# Patient Record
Sex: Male | Born: 1940 | Race: White | Hispanic: No | Marital: Married | Smoking: Current every day smoker
Health system: Southern US, Community
[De-identification: ages and names within clinical notes are randomized; demographics above are authoritative.]

## PROBLEM LIST (undated history)

## (undated) DIAGNOSIS — E119 Type 2 diabetes mellitus without complications: Secondary | ICD-10-CM

## (undated) DIAGNOSIS — I10 Essential (primary) hypertension: Secondary | ICD-10-CM

## (undated) DIAGNOSIS — R001 Bradycardia, unspecified: Secondary | ICD-10-CM

## (undated) DIAGNOSIS — I739 Peripheral vascular disease, unspecified: Secondary | ICD-10-CM

## (undated) DIAGNOSIS — N183 Chronic kidney disease, stage 3 unspecified: Secondary | ICD-10-CM

## (undated) DIAGNOSIS — E785 Hyperlipidemia, unspecified: Secondary | ICD-10-CM

## (undated) DIAGNOSIS — G459 Transient cerebral ischemic attack, unspecified: Secondary | ICD-10-CM

## (undated) DIAGNOSIS — K219 Gastro-esophageal reflux disease without esophagitis: Secondary | ICD-10-CM

## (undated) HISTORY — PX: APPENDECTOMY: SHX54

## (undated) HISTORY — PX: OTHER SURGICAL HISTORY: SHX169

---

## 2001-07-10 ENCOUNTER — Encounter (INDEPENDENT_AMBULATORY_CARE_PROVIDER_SITE_OTHER): Payer: Self-pay | Admitting: Specialist

## 2001-07-10 ENCOUNTER — Ambulatory Visit (HOSPITAL_COMMUNITY): Admission: RE | Admit: 2001-07-10 | Discharge: 2001-07-10 | Payer: Self-pay | Admitting: Gastroenterology

## 2002-02-24 ENCOUNTER — Inpatient Hospital Stay (HOSPITAL_COMMUNITY): Admission: EM | Admit: 2002-02-24 | Discharge: 2002-02-25 | Payer: Self-pay | Admitting: Emergency Medicine

## 2002-02-24 ENCOUNTER — Encounter: Payer: Self-pay | Admitting: Pediatrics

## 2002-02-24 ENCOUNTER — Encounter: Payer: Self-pay | Admitting: Emergency Medicine

## 2009-06-24 ENCOUNTER — Encounter: Admission: RE | Admit: 2009-06-24 | Discharge: 2009-06-24 | Payer: Self-pay | Admitting: Gastroenterology

## 2010-09-30 NOTE — H&P (Signed)
NAME:  Jesse James, Jesse James                        ACCOUNT NO.:  [id number]   MEDICAL RECORD NO.:  [id number]                   PATIENT TYPE:  EMS   LOCATION:  MINO                                 FACILITY:  [facility]   PHYSICIAN:  Deanna Artis. Sharene Skeans, M.D.           DATE OF BIRTH:  [birth year]   DATE OF ADMISSION:  02/24/2002  DATE OF DISCHARGE:                                HISTORY & PHYSICAL   CHIEF COMPLAINT:  Left facial weakness and slurred speech.   HISTORY OF PRESENT ILLNESS:  The patient had onset of symptoms after he had  awakened.  He had had his first cup of coffee and has been able to swallow  it fine.  Around 7:30 he noted that he had difficulty swallowing and  dribbling from the left side of his mouth.  He felt somewhat numb.  He had  no other symptoms at that time and drove to [place], [place], to  drop off a piece of equipment.  His friend noted around 9:30 that the left  corner of his mouth was drooping and told him he needed to get to the  hospital right away.  He arrived at [facility] around  10:22 and was seen by Trudi Ida. Denton Lank, M.D., at 10:30.  Call was placed to  me at 11 o'clock.  CT scan at 11:06.  His symptoms have been stable to  improved since then.   PAST MEDICAL HISTORY:  1. Right body TIA with slurred speech in 1993.  2. Hypercholesterolemia.  3. Neoplastic colonic polyps.  4. Seasonal affective disorder.  5. Tobacco abuse.  6. Peripheral vascular disease with intermittent claudication.   REVIEW OF SYSTEMS:  CONSTITUTIONAL:  Normal appetite and sleep patterns.  HEENT:  No signs of infection or epistaxis.  LUNGS:  No chest pain,  respiratory distress, asthma, bronchitis or pneumonia.  No sputum  production.  No dyspnea.  CARDIOVASCULAR:  No chest pain, palpitations, or  problems with hypertension.  GASTROINTESTINAL:  No nausea, vomiting,  diarrhea or constipation.  No hematemesis or melena.  GENITOURINARY:  No  urinary tract  infection or hematuria.  No dysuria.  MUSCULOSKELETAL:  No  fractures or deformities.  No arthritis, arthralgias or limitation of range  of motion of bones and joints.  No problems in the back.  SKIN:  The patient  had stasis changes in his legs from veinous insufficiency.  No other rash or  neurocutaneous lesions.  ENDOCRINE:  No diabetes, no thyroid disease.  REPRODUCTIVE:  No noted problems with erection, ejaculation.  NEUROPSYCHIATRIC:  The patient has seasonal depression as noted above.  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  He has some amblyopic  changes but no true diplopia.  The patient has not had weakness, numbness or  tingling in his arms or legs.  See  above for his face.  No changes in  hearing.  No tinnitus.  No seizures, loss of consciousness, no loss of bowel  or bladder control.  Review of systems is otherwise negative.   PAST SURGICAL HISTORY:  The patient had colonic polyps removed via  colonoscopy and subsequent colonoscopies have been unremarkable.   MEDICATIONS:  1. Pravachol 40 mg per day.  2. Aspirin 325 mg per day.  3. Trental 400 mg three times a day.  4. Wellbutrin (bupropion) 150 mg twice a day.  5. Lorazepam 1 mg b.i.d.   ALLERGIES:  No known drug allergies.   FAMILY HISTORY:  Mother died at age 61.  She had a congenital heart valve,  multiple MIs, and congestive heart failure.  Father died at age 24 of  myocardial infarction.  Brother died at age 51 of myocardial infarction.  There is a very heavy family history of hypertension and diabetes but no  cerebrovascular disease.   SOCIAL HISTORY:  The patient is a smoker, now smoking two packs of  cigarettes per day.  He is placed on Wellbutrin with the express purpose of  getting him off of tobacco.  He likes smoking.  No use of drugs or alcohol.  The patient works in a rental business in Engineer, maintenance (IT).  He  has been  married for about five years.   PHYSICAL EXAMINATION:  VITAL SIGNS:  Blood pressure 161/73, resting pulse  71, respiratory rate 18, temperature 99, pulse oximeter 97%.  SKIN:  Stasis changes in his ankles and calves related to veinous  insufficiency.  No edema, cyanosis.  The patient has fungal disease of his  toes.  HEENT:  No signs of infection, no bruits.  LUNGS:  Clear.  CARDIOVASCULAR:  No murmurs, pulses normal.  ABDOMEN:  Soft, bowel sounds normal.  No hepatosplenomegaly.  EXTREMITIES:  Normal except for the cutaneous abnormalities noted above.  NEUROLOGIC:  MENTAL STATUS:  The patient was awake, alert, attentive, and  appropriate. No dysphagia or dyspraxia.  Normal fund of knowledge and  memory.  CRANIAL NERVES:  Round reactive pupils.  Normal fundi.  Visual  fields full to double simultaneous stimuli.  Extraocular movements full and  conjugate.  OKN responses equal bilaterally.  The patient has right eye  amblyopia.  He has a left central seventh.  He is able to protrude his  tongue and elevation his uvula to midline.  MOTOR EXAMINATION:  Normal  strength, tone, and mass.  Good fine motor movements.  No pronator drift.  Sensation intact to cold, vibration, stereognosis in the upper extremities.  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  He has good stereognosis bilaterally.  Cerebellar examination and  gait were normal.  Deep tendon reflexes were normal proximally, absent  distally particularly at the ankles.  The patient had bilateral flexor  plantar responses.   IMPRESSION:  1. 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  2. Risk factors include dyslipidemia, tobacco abuse, prior transient     ischemic attack, and peripheral vascular disease.  PLAN:  MRI of the brain with and without contrast.  MRA  intracranial.  Carotid Doppler.  A 2-D echocardiogram.  Serum lipid panel and homocystine  (both fasting).  Hemoglobin A1C.  We will continue aspirin.  We may add  Plavix.  The patient  should have a speech therapy consult.  No heparin for  now.  This has been discussed with the patient and his wife who is a Engineer, civil (consulting)  at [employer].  Questions were answered.  The patient will be  evaluated and proper treatment will be administered.  He will be placed on  21 mg nicotine patch and will not be allowed to smoke.  I have suggested  ways of smoking cessation to the family including his wife quitting or at  least smoking outside the home.                                               Deanna Artis. Sharene Skeans, M.D.    [facility]  D:  02/24/2002  T:  02/25/2002  Job:  [id number]   cc:   Danise Edge, M.D.  [address]  [place]  [place] [address]  Fax: [phone]

## 2010-09-30 NOTE — Discharge Summary (Signed)
NAME:  Jesse James, Jesse James                        ACCOUNT NO.:  [id number]   MEDICAL RECORD NO.:  [id number]                   PATIENT TYPE:  INP   LOCATION:  3024                                 FACILITY:  [facility]   PHYSICIAN:  Casimiro Needle L. Thad Ranger, M.D.           DATE OF BIRTH:  [birth year]   DATE OF ADMISSION:  02/24/2002  DATE OF DISCHARGE:  02/25/2002                                 DISCHARGE SUMMARY   DISCHARGE DIAGNOSES:  1. 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  2. Hypercholesterolemia.  3. Cigarette abuse.  4. Peripheral vascular disease, intermittent claudication.  5. Transient ischemic attack in 1995 and 1993.  6. Surveillance colonoscopy with removal of adenomatous colon polyps and     universal colonic diverticulosis.  7. Seasonal effective disorder.   DISCHARGE MEDICATIONS:  1. Pravachol 40 mg q.d.  2. Aspirin 325 mg q.d.  3. Plavix 75 mg q.d.  4. Wellbutrin 150 mg sustained release b.i.d.  5. Nicotine patch 21 mg x14 days, then 14 mg x14 days, then 7 mg x14 days,     then stop.  6. Stop Trental.   STUDIES PERFORMED:  1. CT scan showing no acute intracranial abnormality.  Atherosclerotic     changes are noted.  2. MRA showing punctate right parietal infarction.  3. MRA of the head normal.  4. 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  Both sides have calcific and     non-calcific plaque throughout.  5. A 2-D echocardiogram with results pending at time of discharge.  6. Chest x-ray which has no active disease.  7. EKG which shows sinus bradycardia, nonspecific T-wave abnormalities, this     is unconfirmed.   LABORATORY DATA:  CBC showed an elevated white blood cell count at 11.2 with  neutrophils absolute at 8.1.  Coagulation studies were normal.  Chemistries  and liver function  tests were also normal.  Fasting lipid profile showed a  cholesterol of 176, triglycerides of 236, HDL 36, and LDL 93.  Homocystine  is pending at time of discharge.   HISTORY OF PRESENT ILLNESS:  The patient is a 70 year old right-handed white  male who had a history of left facial weakness and slurred speech that began  after awakening the morning of admission at 7:30.  He noticed coughing and  dribbling from his mouth when he first got up.  A friend also noted his  facial weakness at 9:30 that morning, and [facility] at 10:22.  The patient was seen by a neurologist at  11 a.m.  The patient was not eligible for TPA due to minimal deficits and  trembling.   HOSPITAL COURSE:  MRI  did reveal a very small punctate right parietal  infarction that could be the cause of his left facial weakness which is  improving during his hospital course.  Stroke was felt to be due to small  vessel disease with no other etiology noted.  Carotids did show some non-  critical disease with 60 to 80% stenosis on the low end of the range  bilaterally.  Recommendations were for a CVTS followup as an outpatient.  The patient needs to continue his Pravachol as his lipid panel looked good.  We will discontinue Trental which was started by Dr. Demetrius Revel many years ago  most likely for peripheral vascular disease.  We will add Plavix to his  aspirin for stroke prophylaxis.  He has no major deficits and can be  discharged home with his wife.  He will need followup on 2-D echocardiogram  and homocystine.   CONDITION ON DISCHARGE:  The patient is alert and oriented x3.  Speech is  clear.  Does have some mild left lower facial weakness.  Extraocular  movements are intact, and visual fields are full.  Strength is normal.  His  deep tendon reflexes are equal bilaterally.  They are decreased in his  knees.  His cerebellar function is intact.  His gait is steady.  His chest  is  clear to auscultation.  His heart rate is regular.   PLAN:  1. Aspirin and Plavix for stroke prophylaxis.  2. Continue Pravachol for hypercholesterolemia.  3. Stop smoking cigarettes.  On Wellbutrin already.  We will add nicotine     patch.  4. CVTS followup as an outpatient.  The patient is to call for an     appointment.  5. Followup 2-D echocardiogram results.  6. Followup homocystine which is pending at time of discharge.  If     homocystine is elevated we will need to start Foltx one p.o. q.d.      Jesse Main, NP                           Marolyn Hammock. Thad Ranger, M.D.    [facility]  D:  02/25/2002  T:  02/27/2002  Job:  [id number]   cc:   Danise Edge, M.D.  [address]  [address]  [place]  [place] [address]  Fax: [phone]   Francisca December, M.D.  [address]  [address]  [place]  [place] [address]  Fax: [phone]

## 2010-09-30 NOTE — Procedures (Signed)
[facility]  Patient:    Jesse James, Jesse James Visit Number: [id number] MRN: [id number]          Service Type: Attending:  Verlin Grills, M.D. Dictated by:   Verlin Grills, M.D. Proc. Date: 07/10/01                             Procedure Report  PROCEDURE:  Colonoscopy with polypectomy.  PROCEDURE INDICATION:  Jesse James is a 70 year old male born 1941-04-23. He has undergone surveillance colonoscopies in the past with removal of neoplastic but noncancerous polyps. His last colonoscopy five years ago resulted in the removal of a 1-mm tubular adenomatous polyp from the rectum. Jesse James is due for a repeat screening colonoscopy with polypectomy to prevent colon cancer.  ENDOSCOPIST:  Verlin Grills, M.D.  PREMEDICATION:  Versed 7 mg, Demerol 50 mg.  ENDOSCOPE:  Olympus Pediatric colonoscope.  DESCRIPTION OF PROCEDURE:  After obtaining informed consent, Jesse James was placed in the left lateral decubitus position. I administered intravenous Versed and intravenous Demerol to achieve conscious sedation for the procedure. The patients blood pressure, oxygen saturation, and cardiac rhythm were monitored throughout the procedure and documented in the medical record.  Anal inspection was normal. Digital rectal exam revealed a slightly enlarged but nonnodular prostate. The Olympus Pediatric video colonoscope was introduced into the rectum and easily advanced to the proximal ascending colon. In order to intubate the cecum, Jesse James was rolled to the prone position. Colonic preparation for the exam today was excellent.  Jesse James has universal colonic diverticulosis without endoscopic evidence for the presence of diverticulitis or diverticular stricture formation.  Rectum:  A 0.5-mm sessile polyp was removed from the distal rectum with the electrocautery snare.  Sigmoid colon and descending colon:  At 40 cm from the  anal verge, a 3-mm sessile polyp was removed with the electrocautery snare.  Splenic flexure:  Normal.  Transverse colon:  Normal.  Hepatic flexure:  Normal.  Ascending colon:  From the proximal ascending colon, at 2-mm sessile polyp was removed with electrocautery snare.  Cecum and ileocecal valve:  Normal.  ASSESSMENT: 1. Universal colonic diverticulosis. 2. A 2-mm polyp was removed from the proximal ascending colon, a 3-mm polyp    was removed from the sigmoid colon, and a 0.5-mm polyp was removed from the    distal rectum. All polyps were submitted in one bottle for pathological    evaluation. Dictated by:   Verlin Grills, M.D. Attending:  Verlin Grills, M.D. DD:  07/10/01 TD:  07/10/01 Job: [id number]

## 2011-02-23 ENCOUNTER — Ambulatory Visit (HOSPITAL_COMMUNITY)
Admission: RE | Admit: 2011-02-23 | Discharge: 2011-02-23 | Disposition: A | Discharge status: Home / Self Care | Payer: Medicare Other | Source: Ambulatory Visit | Attending: Gastroenterology | Admitting: Gastroenterology

## 2011-02-23 DIAGNOSIS — J449 Chronic obstructive pulmonary disease, unspecified: Secondary | ICD-10-CM | POA: Insufficient documentation

## 2011-02-23 DIAGNOSIS — J4489 Other specified chronic obstructive pulmonary disease: Secondary | ICD-10-CM | POA: Insufficient documentation

## 2011-08-07 ENCOUNTER — Other Ambulatory Visit: Payer: Self-pay | Admitting: Gastroenterology

## 2012-12-25 ENCOUNTER — Ambulatory Visit
Admission: RE | Admit: 2012-12-25 | Discharge: 2012-12-25 | Disposition: A | Discharge status: Home / Self Care | Payer: Medicare Other | Source: Ambulatory Visit | Attending: Gastroenterology | Admitting: Gastroenterology

## 2012-12-25 ENCOUNTER — Other Ambulatory Visit: Payer: Self-pay | Admitting: Gastroenterology

## 2012-12-25 DIAGNOSIS — I8390 Asymptomatic varicose veins of unspecified lower extremity: Secondary | ICD-10-CM

## 2013-02-26 ENCOUNTER — Other Ambulatory Visit (HOSPITAL_COMMUNITY): Payer: Self-pay | Admitting: Interventional Radiology

## 2013-02-26 DIAGNOSIS — I83813 Varicose veins of bilateral lower extremities with pain: Secondary | ICD-10-CM

## 2013-03-25 ENCOUNTER — Ambulatory Visit
Admission: RE | Admit: 2013-03-25 | Discharge: 2013-03-25 | Disposition: A | Discharge status: Home / Self Care | Payer: Medicare Other | Source: Ambulatory Visit | Attending: Interventional Radiology | Admitting: Interventional Radiology

## 2013-03-25 DIAGNOSIS — I83813 Varicose veins of bilateral lower extremities with pain: Secondary | ICD-10-CM

## 2013-03-28 ENCOUNTER — Telehealth: Payer: Self-pay | Admitting: Emergency Medicine

## 2013-03-28 ENCOUNTER — Other Ambulatory Visit (HOSPITAL_COMMUNITY): Payer: Self-pay | Admitting: Interventional Radiology

## 2013-03-28 DIAGNOSIS — I83813 Varicose veins of bilateral lower extremities with pain: Secondary | ICD-10-CM

## 2013-03-28 NOTE — Telephone Encounter (Signed)
RN LMOVM HERE TO MAKE [place] AWARE THAT DR Danise Edge SAID IT WAS OK FOR PT TO STOP HIS PLAVIX 5D PRIOR TO EVLT OF LLE

## 2013-03-28 NOTE — Telephone Encounter (Signed)
CALLED PT TO MAKE HIM AWARE THAT [employer] ARE BILLABLE CODES AND NO AUTHO IS NEEDED FOR VEIN TX ALSO THAT DR Josefa Half OK'D FOR HIM TO STOP HIS PLAVIX 5D PRIOR TO VEIN TX.- HE NEEDS TO D/C PLAVIX ON 04-19-13.  PT AWARE

## 2013-04-24 ENCOUNTER — Ambulatory Visit
Admission: RE | Admit: 2013-04-24 | Discharge: 2013-04-24 | Disposition: A | Discharge status: Home / Self Care | Payer: Medicare Other | Source: Ambulatory Visit | Attending: Interventional Radiology | Admitting: Interventional Radiology

## 2013-04-24 VITALS — BP 140/60 | HR 51 | Temp 97.5°F | Resp 14

## 2013-04-24 DIAGNOSIS — I83813 Varicose veins of bilateral lower extremities with pain: Secondary | ICD-10-CM

## 2013-04-24 MED ORDER — DIAZEPAM 5 MG PO TABS: 10.0000 mg | ORAL_TABLET | Freq: Once | ORAL | Status: DC

## 2013-04-24 NOTE — Progress Notes (Signed)
1610  Informed Consent obtained.  Given verbal & written discharge instructions & states that he understands.  0950  22 gauge x 1" insyte catheter started IV Right antecubital on 1st attempt with Saline lock & 7" extension.  Flushed with 10 ml NSS without difficulty.  SIte unremarkable.  9604  Valium 10 mg po per Dr Miles Costain.  1015  Procedure started.  1130 Procedure completed.  1140  IV d/c'd, catheter intact.  Site unremarkable.  1145  Wearing thigh high graduated compression garment, 20-30 mm Hg, on LLE.  Ambulated x 10 minutes, gait steady.  Tolerated well.    1155 Patient's neighbor here to provide transportation to home.  Discharged to home. Terez Montee Carmell Austria, RN 04/24/2013 11:56 AM

## 2013-04-29 ENCOUNTER — Other Ambulatory Visit (HOSPITAL_COMMUNITY): Payer: Self-pay | Admitting: Interventional Radiology

## 2013-04-29 DIAGNOSIS — I83813 Varicose veins of bilateral lower extremities with pain: Secondary | ICD-10-CM

## 2013-05-01 ENCOUNTER — Ambulatory Visit
Admission: RE | Admit: 2013-05-01 | Discharge: 2013-05-01 | Disposition: A | Discharge status: Home / Self Care | Payer: Medicare Other | Source: Ambulatory Visit | Attending: Interventional Radiology | Admitting: Interventional Radiology

## 2013-05-01 DIAGNOSIS — I83813 Varicose veins of bilateral lower extremities with pain: Secondary | ICD-10-CM

## 2013-05-06 ENCOUNTER — Other Ambulatory Visit (HOSPITAL_COMMUNITY): Payer: Self-pay | Admitting: Interventional Radiology

## 2013-05-06 DIAGNOSIS — I83813 Varicose veins of bilateral lower extremities with pain: Secondary | ICD-10-CM

## 2013-05-19 ENCOUNTER — Telehealth: Payer: Self-pay | Admitting: Emergency Medicine

## 2013-05-19 NOTE — Telephone Encounter (Signed)
CALLED PT TO MAKE HIM AWARE THAT [employer] APPROVED HIS RLE EVLT & SCLERO  APPT GOOD TO GO FOR 05-28-13 AND REMINDED PT TO STOP PLAVIX ON 05-23-13.

## 2013-05-27 ENCOUNTER — Other Ambulatory Visit (HOSPITAL_COMMUNITY): Payer: Self-pay | Admitting: Interventional Radiology

## 2013-05-27 DIAGNOSIS — I83811 Varicose veins of right lower extremities with pain: Secondary | ICD-10-CM

## 2013-05-28 ENCOUNTER — Ambulatory Visit
Admission: RE | Admit: 2013-05-28 | Discharge: 2013-05-28 | Disposition: A | Discharge status: Home / Self Care | Payer: Medicare HMO | Source: Ambulatory Visit | Attending: Interventional Radiology | Admitting: Interventional Radiology

## 2013-05-28 ENCOUNTER — Other Ambulatory Visit (HOSPITAL_COMMUNITY): Payer: Self-pay | Admitting: Interventional Radiology

## 2013-05-28 VITALS — BP 140/59 | HR 51 | Temp 98.3°F | Resp 16 | Ht 70.0 in | Wt 215.0 lb

## 2013-05-28 DIAGNOSIS — I83813 Varicose veins of bilateral lower extremities with pain: Secondary | ICD-10-CM

## 2013-05-28 DIAGNOSIS — I83893 Varicose veins of bilateral lower extremities with other complications: Secondary | ICD-10-CM

## 2013-05-28 DIAGNOSIS — I83811 Varicose veins of right lower extremities with pain: Secondary | ICD-10-CM

## 2013-05-28 MED ORDER — DIAZEPAM 10 MG PO TABS: 10.0000 mg | ORAL_TABLET | Freq: Once | ORAL | Status: DC

## 2013-05-28 NOTE — Progress Notes (Signed)
1250  Informed Consent obtained.  Given verbal & written discharge instructions and states that he understands.     1355  Valium 10 mg po per Dr Annamaria Boots.  1400  22 gauge x 1" insyte catheter started IV Right antecubital (on 1st attempt) with Saline Lock and 7" extension.  Flushed w/ 5 ml NSS without difficulty.  Site unremarkable.  1410  Procedure started.  1520  Procedure completed.  1530  Wearing thigh high graduated compression garment, 20-30 mm Hg on LLE.   Ambulated x 10 mins, gait steady.  Tolerated well.  1540 Reviewed discharge instructions.   1545  Patient's neighbors here to provide transportation to home.  Discharged to home.  Carleigh Buccieri Riki Rusk, RN 05/28/2013 4:05 PM

## 2013-06-04 ENCOUNTER — Ambulatory Visit
Admission: RE | Admit: 2013-06-04 | Discharge: 2013-06-04 | Disposition: A | Discharge status: Home / Self Care | Payer: Medicare HMO | Source: Ambulatory Visit | Attending: Interventional Radiology | Admitting: Interventional Radiology

## 2013-06-04 DIAGNOSIS — I83811 Varicose veins of right lower extremities with pain: Secondary | ICD-10-CM

## 2013-07-08 ENCOUNTER — Ambulatory Visit
Admission: RE | Admit: 2013-07-08 | Discharge: 2013-07-08 | Disposition: A | Discharge status: Home / Self Care | Payer: Medicare HMO | Source: Ambulatory Visit | Attending: Interventional Radiology | Admitting: Interventional Radiology

## 2013-07-08 DIAGNOSIS — I83811 Varicose veins of right lower extremities with pain: Secondary | ICD-10-CM

## 2013-10-23 ENCOUNTER — Other Ambulatory Visit (HOSPITAL_COMMUNITY): Payer: Self-pay | Admitting: Interventional Radiology

## 2013-10-23 DIAGNOSIS — I83813 Varicose veins of bilateral lower extremities with pain: Secondary | ICD-10-CM

## 2013-12-03 ENCOUNTER — Ambulatory Visit
Admission: RE | Admit: 2013-12-03 | Discharge: 2013-12-03 | Disposition: A | Discharge status: Home / Self Care | Payer: Medicare HMO | Source: Ambulatory Visit | Attending: Interventional Radiology | Admitting: Interventional Radiology

## 2013-12-03 DIAGNOSIS — I83813 Varicose veins of bilateral lower extremities with pain: Secondary | ICD-10-CM

## 2014-05-20 ENCOUNTER — Other Ambulatory Visit (HOSPITAL_COMMUNITY): Payer: Self-pay | Admitting: Interventional Radiology

## 2014-05-20 DIAGNOSIS — I83811 Varicose veins of right lower extremities with pain: Secondary | ICD-10-CM

## 2014-06-10 DIAGNOSIS — I10 Essential (primary) hypertension: Secondary | ICD-10-CM | POA: Diagnosis not present

## 2014-06-10 DIAGNOSIS — Z86010 Personal history of colonic polyps: Secondary | ICD-10-CM | POA: Diagnosis not present

## 2014-06-10 DIAGNOSIS — E780 Pure hypercholesterolemia: Secondary | ICD-10-CM | POA: Diagnosis not present

## 2014-06-10 DIAGNOSIS — I739 Peripheral vascular disease, unspecified: Secondary | ICD-10-CM | POA: Diagnosis not present

## 2014-06-10 DIAGNOSIS — I779 Disorder of arteries and arterioles, unspecified: Secondary | ICD-10-CM | POA: Diagnosis not present

## 2014-06-10 DIAGNOSIS — Z23 Encounter for immunization: Secondary | ICD-10-CM | POA: Diagnosis not present

## 2014-06-10 DIAGNOSIS — E119 Type 2 diabetes mellitus without complications: Secondary | ICD-10-CM | POA: Diagnosis not present

## 2014-06-10 DIAGNOSIS — Z0001 Encounter for general adult medical examination with abnormal findings: Secondary | ICD-10-CM | POA: Diagnosis not present

## 2014-06-11 ENCOUNTER — Other Ambulatory Visit: Payer: Self-pay | Admitting: Gastroenterology

## 2014-06-11 DIAGNOSIS — R58 Hemorrhage, not elsewhere classified: Secondary | ICD-10-CM

## 2014-06-12 ENCOUNTER — Other Ambulatory Visit: Payer: Medicare HMO

## 2014-06-15 ENCOUNTER — Other Ambulatory Visit: Payer: Medicare HMO

## 2014-06-17 ENCOUNTER — Ambulatory Visit
Admission: RE | Admit: 2014-06-17 | Discharge: 2014-06-17 | Disposition: A | Discharge status: Home / Self Care | Payer: Commercial Managed Care - HMO | Source: Ambulatory Visit | Attending: Gastroenterology | Admitting: Gastroenterology

## 2014-06-17 DIAGNOSIS — R58 Hemorrhage, not elsewhere classified: Secondary | ICD-10-CM

## 2014-06-17 DIAGNOSIS — K449 Diaphragmatic hernia without obstruction or gangrene: Secondary | ICD-10-CM | POA: Diagnosis not present

## 2014-06-17 DIAGNOSIS — K219 Gastro-esophageal reflux disease without esophagitis: Secondary | ICD-10-CM | POA: Diagnosis not present

## 2014-06-19 DIAGNOSIS — R195 Other fecal abnormalities: Secondary | ICD-10-CM | POA: Diagnosis not present

## 2014-07-07 ENCOUNTER — Other Ambulatory Visit: Payer: Self-pay | Admitting: Gastroenterology

## 2014-07-07 DIAGNOSIS — K921 Melena: Secondary | ICD-10-CM

## 2014-07-08 DIAGNOSIS — H2513 Age-related nuclear cataract, bilateral: Secondary | ICD-10-CM | POA: Diagnosis not present

## 2014-07-08 DIAGNOSIS — H5052 Exophoria: Secondary | ICD-10-CM | POA: Diagnosis not present

## 2014-07-08 DIAGNOSIS — H5022 Vertical strabismus, left eye: Secondary | ICD-10-CM | POA: Diagnosis not present

## 2014-07-08 DIAGNOSIS — E119 Type 2 diabetes mellitus without complications: Secondary | ICD-10-CM | POA: Diagnosis not present

## 2014-07-13 DIAGNOSIS — H2181 Floppy iris syndrome: Secondary | ICD-10-CM | POA: Diagnosis not present

## 2014-07-13 DIAGNOSIS — H2511 Age-related nuclear cataract, right eye: Secondary | ICD-10-CM | POA: Diagnosis not present

## 2014-07-14 DIAGNOSIS — H2512 Age-related nuclear cataract, left eye: Secondary | ICD-10-CM | POA: Diagnosis not present

## 2014-07-15 DIAGNOSIS — D509 Iron deficiency anemia, unspecified: Secondary | ICD-10-CM | POA: Diagnosis not present

## 2014-07-20 DIAGNOSIS — H2512 Age-related nuclear cataract, left eye: Secondary | ICD-10-CM | POA: Diagnosis not present

## 2014-07-20 DIAGNOSIS — H2511 Age-related nuclear cataract, right eye: Secondary | ICD-10-CM | POA: Diagnosis not present

## 2014-07-20 DIAGNOSIS — H21562 Pupillary abnormality, left eye: Secondary | ICD-10-CM | POA: Diagnosis not present

## 2014-08-25 DIAGNOSIS — Z23 Encounter for immunization: Secondary | ICD-10-CM | POA: Diagnosis not present

## 2014-08-25 DIAGNOSIS — E780 Pure hypercholesterolemia: Secondary | ICD-10-CM | POA: Diagnosis not present

## 2014-08-25 DIAGNOSIS — G459 Transient cerebral ischemic attack, unspecified: Secondary | ICD-10-CM | POA: Diagnosis not present

## 2014-08-25 DIAGNOSIS — R7301 Impaired fasting glucose: Secondary | ICD-10-CM | POA: Diagnosis not present

## 2014-08-25 DIAGNOSIS — I739 Peripheral vascular disease, unspecified: Secondary | ICD-10-CM | POA: Diagnosis not present

## 2014-08-25 DIAGNOSIS — I779 Disorder of arteries and arterioles, unspecified: Secondary | ICD-10-CM | POA: Diagnosis not present

## 2014-08-25 DIAGNOSIS — F17210 Nicotine dependence, cigarettes, uncomplicated: Secondary | ICD-10-CM | POA: Diagnosis not present

## 2014-08-25 DIAGNOSIS — I10 Essential (primary) hypertension: Secondary | ICD-10-CM | POA: Diagnosis not present

## 2014-09-04 DIAGNOSIS — D509 Iron deficiency anemia, unspecified: Secondary | ICD-10-CM | POA: Diagnosis not present

## 2014-10-21 ENCOUNTER — Other Ambulatory Visit: Payer: Self-pay | Admitting: Internal Medicine

## 2014-10-21 DIAGNOSIS — G459 Transient cerebral ischemic attack, unspecified: Secondary | ICD-10-CM | POA: Diagnosis not present

## 2014-10-21 DIAGNOSIS — E780 Pure hypercholesterolemia: Secondary | ICD-10-CM | POA: Diagnosis not present

## 2014-10-21 DIAGNOSIS — I779 Disorder of arteries and arterioles, unspecified: Secondary | ICD-10-CM | POA: Diagnosis not present

## 2014-10-21 DIAGNOSIS — K449 Diaphragmatic hernia without obstruction or gangrene: Secondary | ICD-10-CM | POA: Diagnosis not present

## 2014-10-21 DIAGNOSIS — I739 Peripheral vascular disease, unspecified: Secondary | ICD-10-CM | POA: Diagnosis not present

## 2014-10-21 DIAGNOSIS — F17210 Nicotine dependence, cigarettes, uncomplicated: Secondary | ICD-10-CM

## 2014-10-21 DIAGNOSIS — I10 Essential (primary) hypertension: Secondary | ICD-10-CM | POA: Diagnosis not present

## 2014-10-21 DIAGNOSIS — D649 Anemia, unspecified: Secondary | ICD-10-CM | POA: Diagnosis not present

## 2014-10-23 ENCOUNTER — Ambulatory Visit
Admission: RE | Admit: 2014-10-23 | Discharge: 2014-10-23 | Disposition: A | Discharge status: Home / Self Care | Payer: Commercial Managed Care - HMO | Source: Ambulatory Visit | Attending: Internal Medicine | Admitting: Internal Medicine

## 2014-10-23 DIAGNOSIS — Z87891 Personal history of nicotine dependence: Secondary | ICD-10-CM | POA: Diagnosis not present

## 2014-10-23 DIAGNOSIS — J432 Centrilobular emphysema: Secondary | ICD-10-CM | POA: Diagnosis not present

## 2014-10-23 DIAGNOSIS — R739 Hyperglycemia, unspecified: Secondary | ICD-10-CM | POA: Diagnosis not present

## 2014-10-23 DIAGNOSIS — F17210 Nicotine dependence, cigarettes, uncomplicated: Secondary | ICD-10-CM

## 2014-10-23 DIAGNOSIS — Z122 Encounter for screening for malignant neoplasm of respiratory organs: Secondary | ICD-10-CM | POA: Diagnosis not present

## 2014-12-15 DIAGNOSIS — R351 Nocturia: Secondary | ICD-10-CM | POA: Diagnosis not present

## 2014-12-15 DIAGNOSIS — N401 Enlarged prostate with lower urinary tract symptoms: Secondary | ICD-10-CM | POA: Diagnosis not present

## 2015-06-16 DIAGNOSIS — G459 Transient cerebral ischemic attack, unspecified: Secondary | ICD-10-CM | POA: Diagnosis not present

## 2015-06-16 DIAGNOSIS — R001 Bradycardia, unspecified: Secondary | ICD-10-CM | POA: Diagnosis not present

## 2015-06-16 DIAGNOSIS — F17210 Nicotine dependence, cigarettes, uncomplicated: Secondary | ICD-10-CM | POA: Diagnosis not present

## 2015-06-16 DIAGNOSIS — Z0000 Encounter for general adult medical examination without abnormal findings: Secondary | ICD-10-CM | POA: Diagnosis not present

## 2015-06-16 DIAGNOSIS — R7301 Impaired fasting glucose: Secondary | ICD-10-CM | POA: Diagnosis not present

## 2015-06-16 DIAGNOSIS — I739 Peripheral vascular disease, unspecified: Secondary | ICD-10-CM | POA: Diagnosis not present

## 2015-06-16 DIAGNOSIS — I779 Disorder of arteries and arterioles, unspecified: Secondary | ICD-10-CM | POA: Diagnosis not present

## 2015-06-16 DIAGNOSIS — I10 Essential (primary) hypertension: Secondary | ICD-10-CM | POA: Diagnosis not present

## 2015-06-16 DIAGNOSIS — Z1389 Encounter for screening for other disorder: Secondary | ICD-10-CM | POA: Diagnosis not present

## 2015-09-01 DIAGNOSIS — Z961 Presence of intraocular lens: Secondary | ICD-10-CM | POA: Diagnosis not present

## 2015-09-01 DIAGNOSIS — H26493 Other secondary cataract, bilateral: Secondary | ICD-10-CM | POA: Diagnosis not present

## 2015-09-01 DIAGNOSIS — E119 Type 2 diabetes mellitus without complications: Secondary | ICD-10-CM | POA: Diagnosis not present

## 2015-09-02 DIAGNOSIS — Z961 Presence of intraocular lens: Secondary | ICD-10-CM | POA: Diagnosis not present

## 2015-09-02 DIAGNOSIS — H26493 Other secondary cataract, bilateral: Secondary | ICD-10-CM | POA: Diagnosis not present

## 2015-09-08 DIAGNOSIS — Z961 Presence of intraocular lens: Secondary | ICD-10-CM | POA: Diagnosis not present

## 2015-09-08 DIAGNOSIS — H26492 Other secondary cataract, left eye: Secondary | ICD-10-CM | POA: Diagnosis not present

## 2015-09-26 IMAGING — US IR EMBO VENOUS NOT [PERSON_NAME]  INC GUIDE ROADMAPPING
1 series · 5 of 5 positions shown · non-contrast
Comparison: none

CLINICAL DATA: Bilateral venous insufficiency of the great
saphenous veins, varicose veins, leg pain, edema

[Series 1: ir embo venous not (person_name) inc guide roadmap · 0.09mm/px · 5 of 5 slices shown]
[im 1/5]
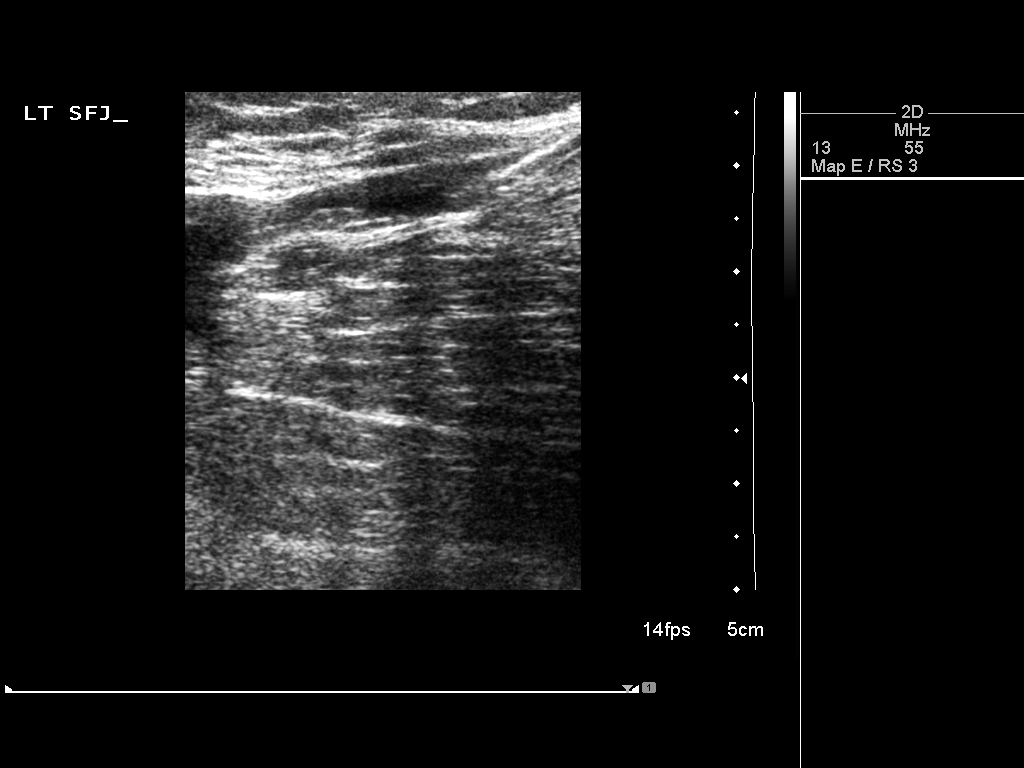
[im 2/5]
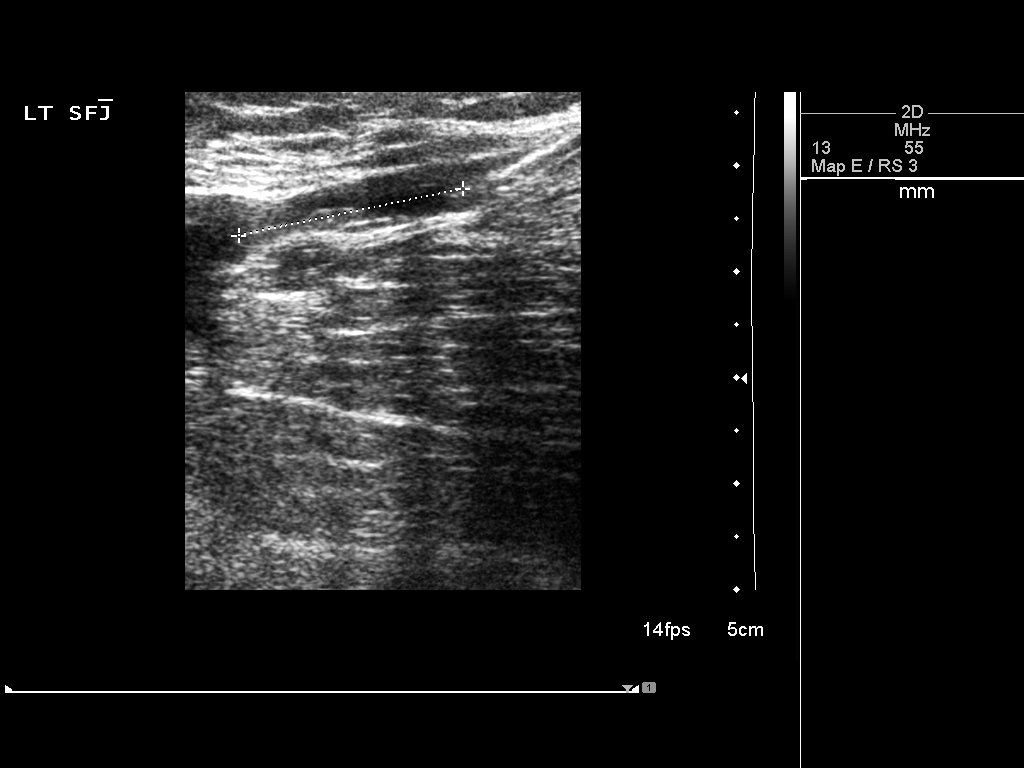
[im 3/5]
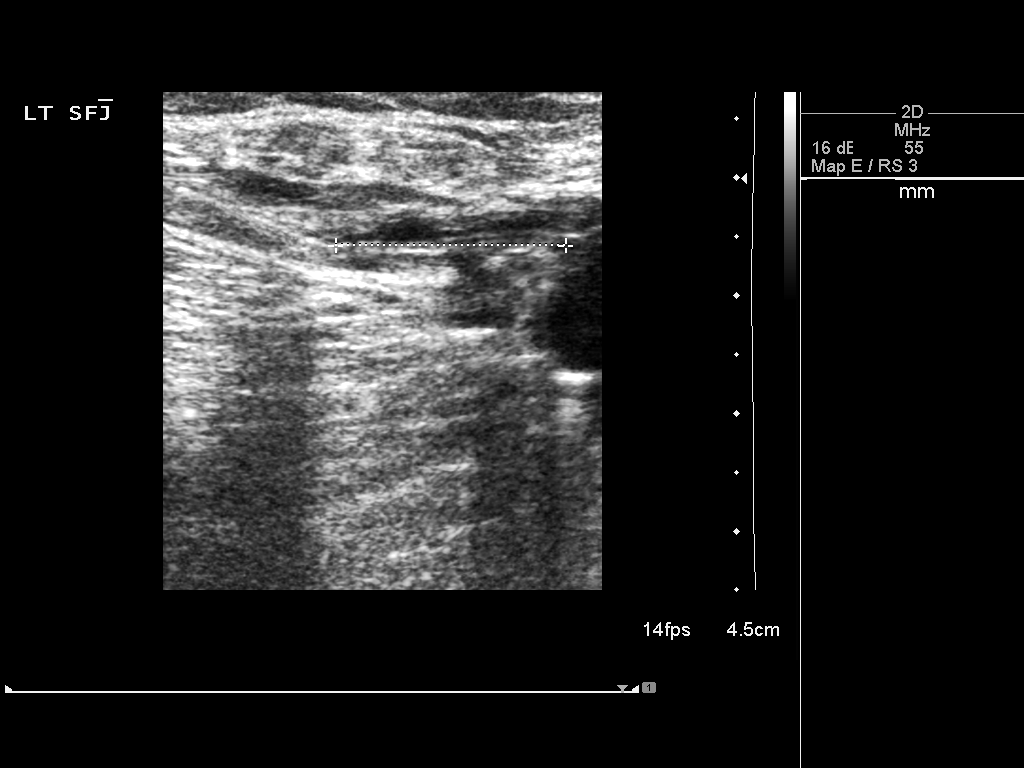
[im 4/5]
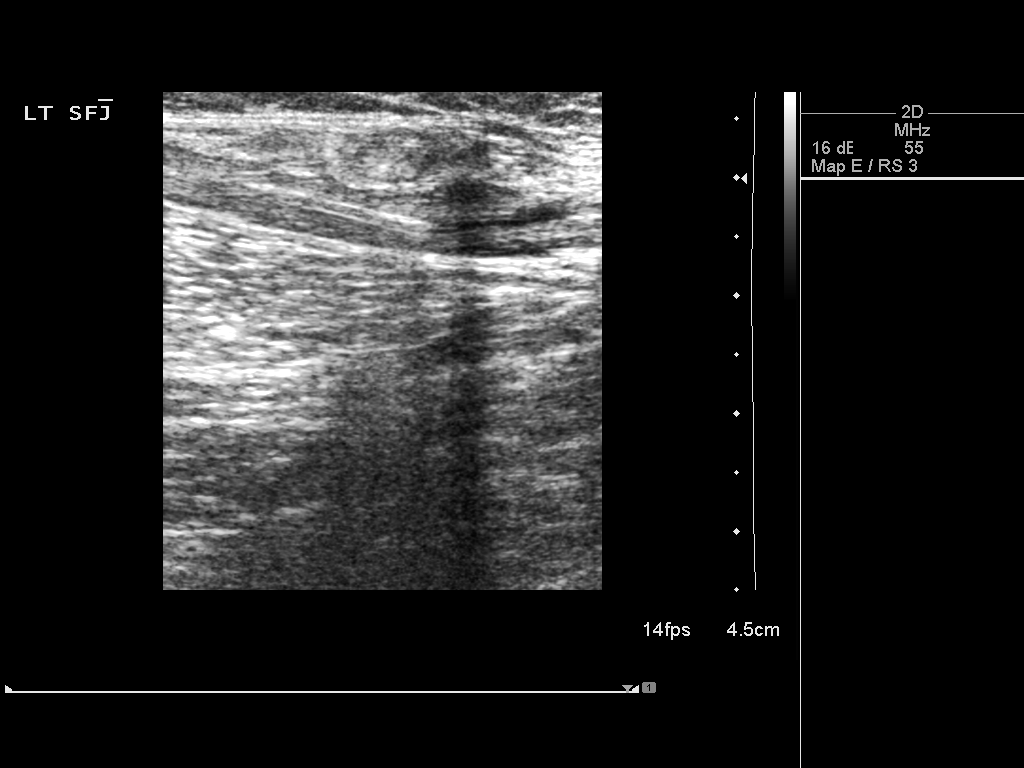
[im 5/5]
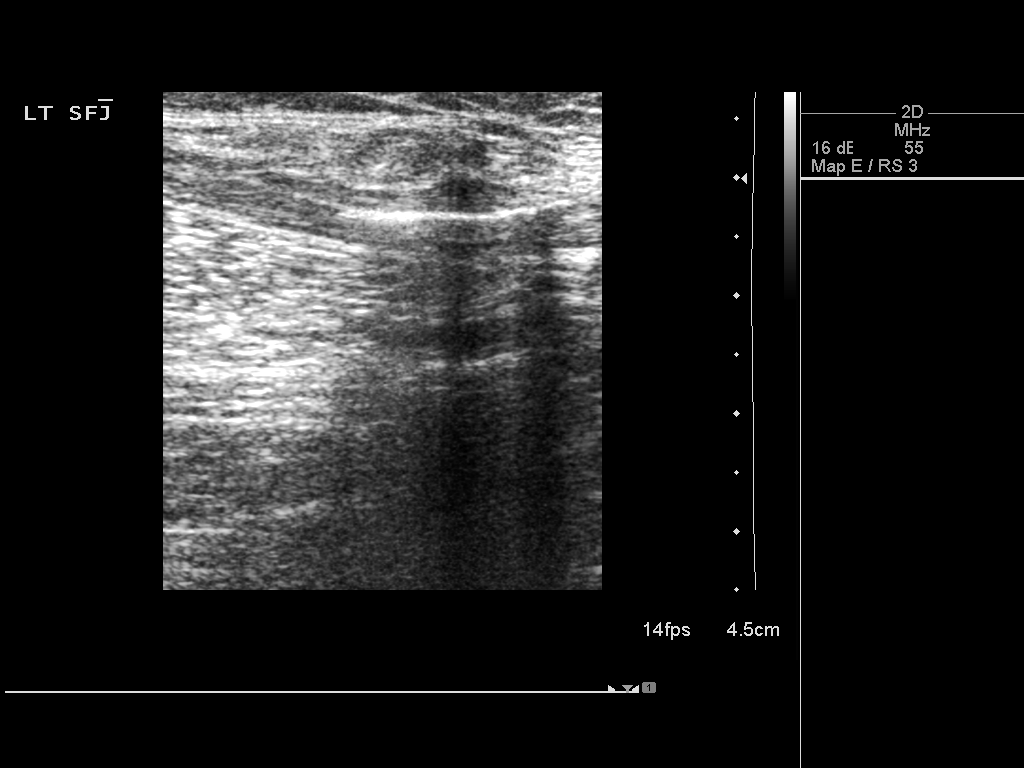

[5 of 5 positions shown; findings below may reference images not displayed]

EXAM:
ULTRASOUND LEFT GSV TRANSCATHETER LASER OCCLUSION

Radiologist:  Walle, Tapan

Guidance:  Ultrasound

MEDICATIONS AND MEDICAL HISTORY:
10 mg of Valium PO, 460 cc diluted 1% lidocaine for local and
Tumescent anesthesia

PROCEDURE:
Informed consent was obtained from the patient following explanation
of the procedure, risks, benefits and alternatives. The patient
understands, agrees and consents for the procedure. All questions
were addressed. A time out was performed.

Maximal barrier sterile technique utilized including caps, mask,
sterile gowns, sterile gloves, large sterile drape, hand hygiene,
and Betadine.

Preliminary venous mapping performed of the left GSV from the
saphenous femoral junction to the mid calf level. Entire left lower
extremity sterilely prepped and draped with Betadine and sterile
drapes. Ultrasound micropuncture venous access performed of the left
GSV in the mid calf level. Guidewire advanced followed by the
transitional dilator. Guidewire exchange for the 035 guidewire
advanced across the saphenous femoral junction with minor
difficulty. 4 French delivery sheath inserted. Delivery sheath
position 2 cm below the saphenous femoral junction and confirmed
with ultrasound. Images obtained for documentation. Laser fiber
advanced through the sheath but not yet exposed to the native vein
wall. 460 cc diluted 1% lidocaine instilled subcutaneously along the
laser fiber and delivery sheath from the proximal calf to the
saphenous femoral junction. There was adequate skin depth and vein
collapsed around the laser fiber and delivery sheath from the
instilled Lidocaine. Laser fiber was exposed to the native vein wall
and re- confirmed to be 2 cm below the saphenous femoral junction.
Laser fiber was enable at a power setting of 10 Payton to treat 50 cm
length of left GSV over 294 seconds delivering 2,934 Joules.

Laser fiber removed. Hemostasis obtained with compression. Sterile
dressing applied.

Patient was placed in a 20/30 mmHg compression stocking and
ambulated. He was stable for discharge 30 min later.

Follow-up planned in 1 week.

COMPLICATIONS:
none
IMPRESSION: Successful ultrasound left GSV transcatheter laser occlusion.

## 2015-11-30 DIAGNOSIS — R300 Dysuria: Secondary | ICD-10-CM | POA: Diagnosis not present

## 2015-11-30 DIAGNOSIS — R3914 Feeling of incomplete bladder emptying: Secondary | ICD-10-CM | POA: Diagnosis not present

## 2015-12-14 DIAGNOSIS — R3914 Feeling of incomplete bladder emptying: Secondary | ICD-10-CM | POA: Diagnosis not present

## 2015-12-15 DIAGNOSIS — I10 Essential (primary) hypertension: Secondary | ICD-10-CM | POA: Diagnosis not present

## 2015-12-15 DIAGNOSIS — N183 Chronic kidney disease, stage 3 (moderate): Secondary | ICD-10-CM | POA: Diagnosis not present

## 2015-12-15 DIAGNOSIS — G459 Transient cerebral ischemic attack, unspecified: Secondary | ICD-10-CM | POA: Diagnosis not present

## 2015-12-15 DIAGNOSIS — E119 Type 2 diabetes mellitus without complications: Secondary | ICD-10-CM | POA: Diagnosis not present

## 2015-12-15 DIAGNOSIS — F17210 Nicotine dependence, cigarettes, uncomplicated: Secondary | ICD-10-CM | POA: Diagnosis not present

## 2015-12-15 DIAGNOSIS — I739 Peripheral vascular disease, unspecified: Secondary | ICD-10-CM | POA: Diagnosis not present

## 2015-12-15 DIAGNOSIS — I779 Disorder of arteries and arterioles, unspecified: Secondary | ICD-10-CM | POA: Diagnosis not present

## 2016-02-07 DIAGNOSIS — N3000 Acute cystitis without hematuria: Secondary | ICD-10-CM | POA: Diagnosis not present

## 2016-02-07 DIAGNOSIS — R3911 Hesitancy of micturition: Secondary | ICD-10-CM | POA: Diagnosis not present

## 2016-02-07 DIAGNOSIS — N401 Enlarged prostate with lower urinary tract symptoms: Secondary | ICD-10-CM | POA: Diagnosis not present

## 2016-02-07 DIAGNOSIS — R3914 Feeling of incomplete bladder emptying: Secondary | ICD-10-CM | POA: Diagnosis not present

## 2016-02-07 DIAGNOSIS — R3912 Poor urinary stream: Secondary | ICD-10-CM | POA: Diagnosis not present

## 2016-06-29 DIAGNOSIS — I10 Essential (primary) hypertension: Secondary | ICD-10-CM | POA: Diagnosis not present

## 2016-06-29 DIAGNOSIS — I779 Disorder of arteries and arterioles, unspecified: Secondary | ICD-10-CM | POA: Diagnosis not present

## 2016-06-29 DIAGNOSIS — Z0000 Encounter for general adult medical examination without abnormal findings: Secondary | ICD-10-CM | POA: Diagnosis not present

## 2016-06-29 DIAGNOSIS — E1165 Type 2 diabetes mellitus with hyperglycemia: Secondary | ICD-10-CM | POA: Diagnosis not present

## 2016-06-29 DIAGNOSIS — I739 Peripheral vascular disease, unspecified: Secondary | ICD-10-CM | POA: Diagnosis not present

## 2016-06-29 DIAGNOSIS — Z1389 Encounter for screening for other disorder: Secondary | ICD-10-CM | POA: Diagnosis not present

## 2016-06-29 DIAGNOSIS — E7800 Pure hypercholesterolemia, unspecified: Secondary | ICD-10-CM | POA: Diagnosis not present

## 2016-06-29 DIAGNOSIS — Z86010 Personal history of colonic polyps: Secondary | ICD-10-CM | POA: Diagnosis not present

## 2016-06-29 DIAGNOSIS — N183 Chronic kidney disease, stage 3 (moderate): Secondary | ICD-10-CM | POA: Diagnosis not present

## 2016-06-29 DIAGNOSIS — N400 Enlarged prostate without lower urinary tract symptoms: Secondary | ICD-10-CM | POA: Diagnosis not present

## 2016-07-25 DIAGNOSIS — R3914 Feeling of incomplete bladder emptying: Secondary | ICD-10-CM | POA: Diagnosis not present

## 2016-07-25 DIAGNOSIS — N401 Enlarged prostate with lower urinary tract symptoms: Secondary | ICD-10-CM | POA: Diagnosis not present

## 2016-07-25 DIAGNOSIS — R3911 Hesitancy of micturition: Secondary | ICD-10-CM | POA: Diagnosis not present

## 2016-08-30 DIAGNOSIS — Z1211 Encounter for screening for malignant neoplasm of colon: Secondary | ICD-10-CM | POA: Diagnosis not present

## 2016-09-13 DIAGNOSIS — E119 Type 2 diabetes mellitus without complications: Secondary | ICD-10-CM | POA: Diagnosis not present

## 2016-09-13 DIAGNOSIS — H5022 Vertical strabismus, left eye: Secondary | ICD-10-CM | POA: Diagnosis not present

## 2016-10-25 DIAGNOSIS — R3914 Feeling of incomplete bladder emptying: Secondary | ICD-10-CM | POA: Diagnosis not present

## 2016-10-25 DIAGNOSIS — N401 Enlarged prostate with lower urinary tract symptoms: Secondary | ICD-10-CM | POA: Diagnosis not present

## 2016-10-25 DIAGNOSIS — R3911 Hesitancy of micturition: Secondary | ICD-10-CM | POA: Diagnosis not present

## 2016-10-25 DIAGNOSIS — R3912 Poor urinary stream: Secondary | ICD-10-CM | POA: Diagnosis not present

## 2016-11-18 IMAGING — RF DG UGI W/ HIGH DENSITY W/KUB
19 of 24 series · 19 of 24 positions shown · non-contrast
Comparison: None.

CLINICAL DATA: Bleeding per rectum previously

EXAM:
UPPER GI SERIES WITH KUB
TECHNIQUE: After obtaining a scout radiograph a routine upper GI series was
performed using thin and high density barium.
FLUOROSCOPY TIME:  Radiation Exposure Index (as provided by the
fluoroscopic device):
If the device does not provide the exposure index:
Fluoroscopy Time (in minutes and seconds):  2 min 0 seconds
Number of Acquired Images:  8

[Series 2: run · 1 of 1 slices shown (1 of 19)]
[im 1/1]
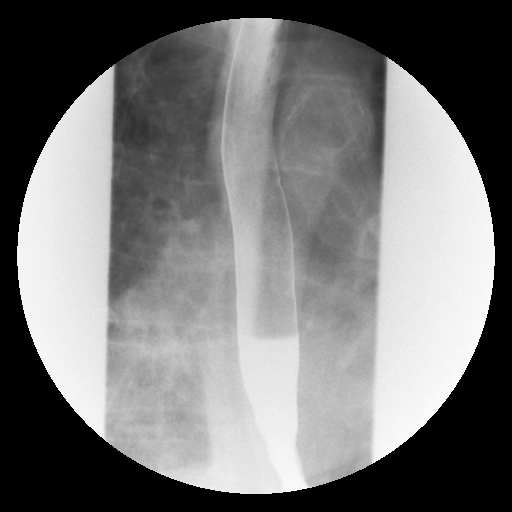

[Series 3: run · 1 of 1 slices shown (2 of 19)]
[im 1/1]
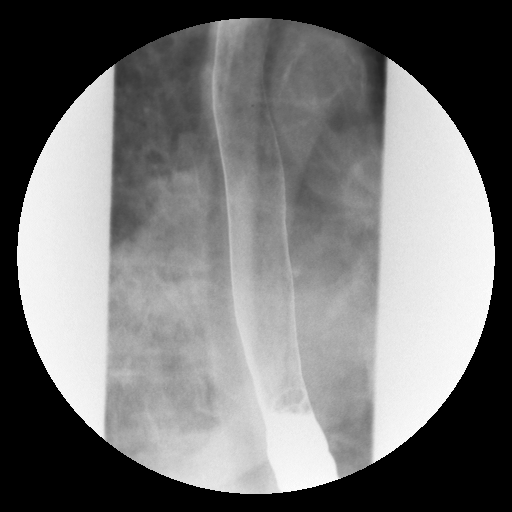

[Series 5: run · 1 of 1 slices shown (3 of 19)]
[im 1/1]
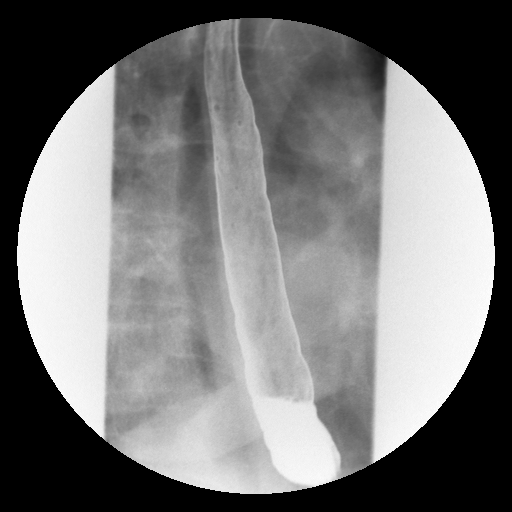

[Series 6: run · 1 of 1 slices shown (4 of 19)]
[im 1/1]
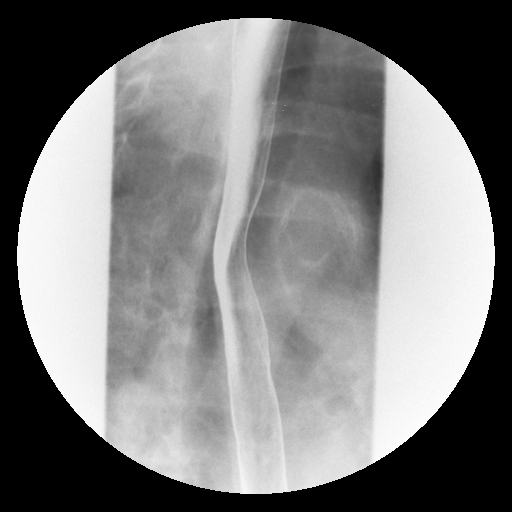

[Series 7: run · 1 of 1 slices shown (5 of 19)]
[im 1/1]
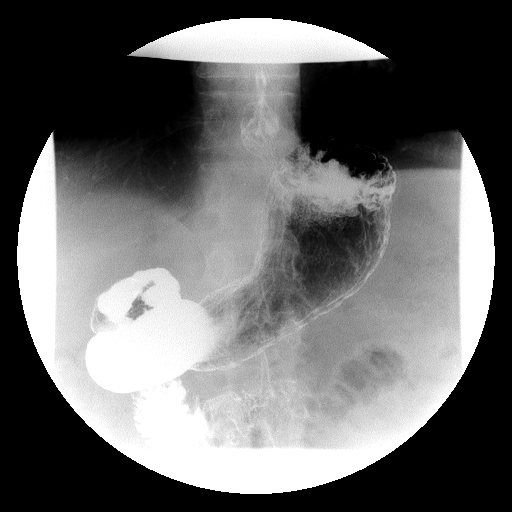

[Series 8: run · 1 of 1 slices shown (6 of 19)]
[im 1/1]
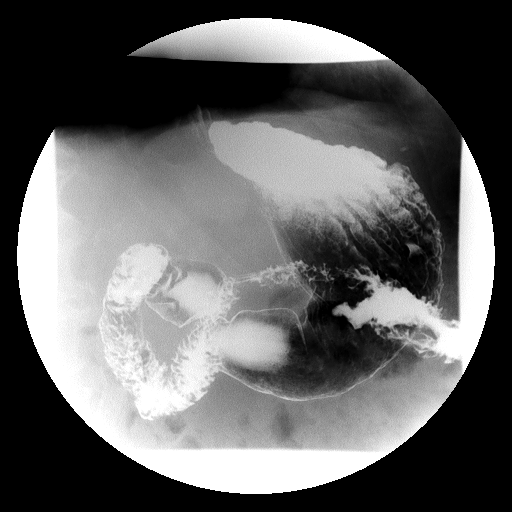

[Series 10: run · 1 of 1 slices shown (7 of 19)]
[im 1/1]
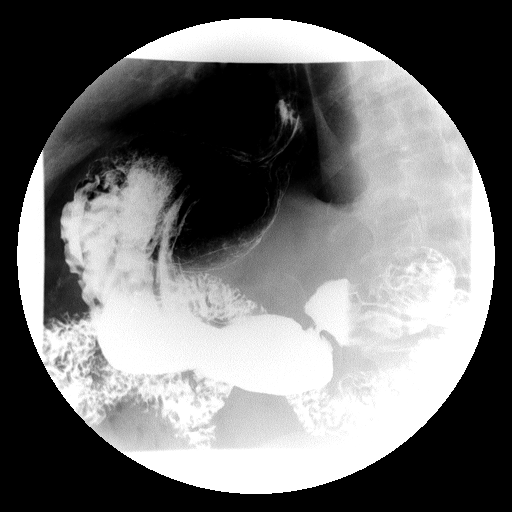

[Series 11: run · 1 of 1 slices shown (8 of 19)]
[im 1/1]
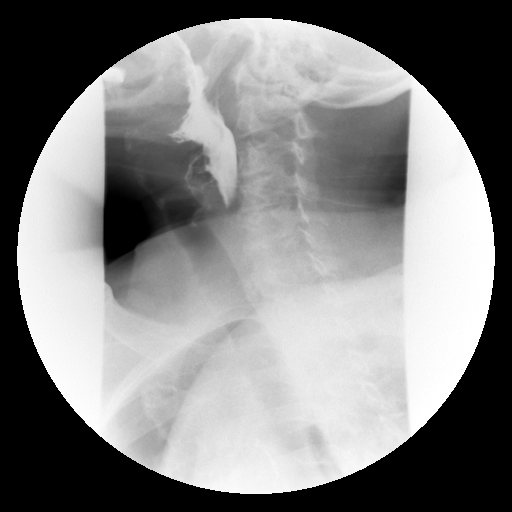

[Series 12: run · 1 of 2 slices shown (9 of 19)]
[im 1/2]
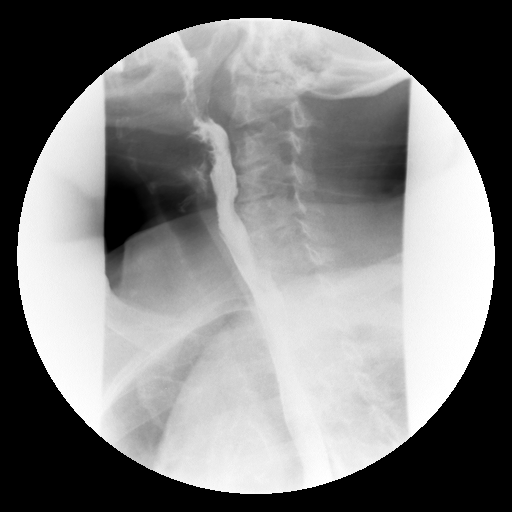

[Series 14: run · 1 of 1 slices shown (10 of 19)]
[im 1/1]
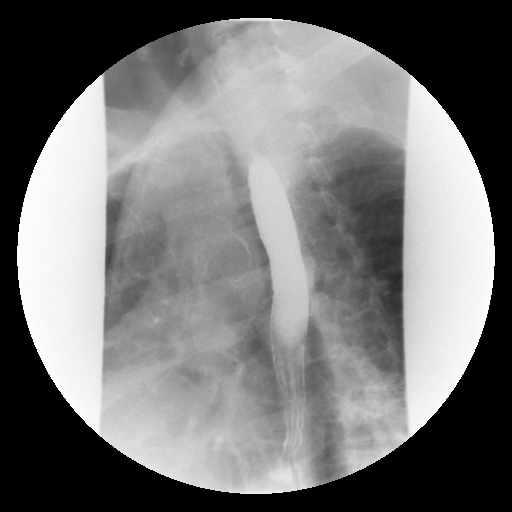

[Series 15: run · 1 of 1 slices shown (11 of 19)]
[im 1/1]
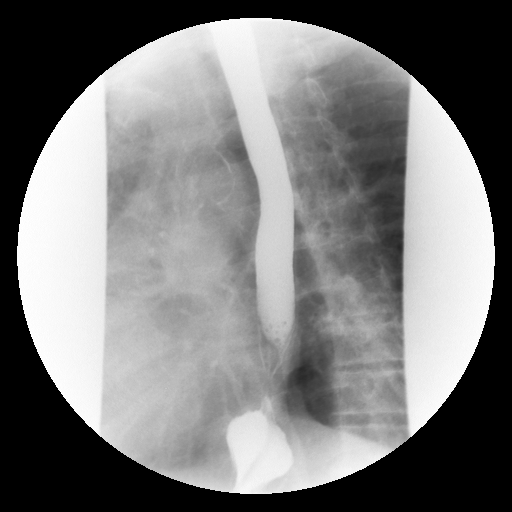

[Series 16: run · 1 of 1 slices shown (12 of 19)]
[im 1/1]
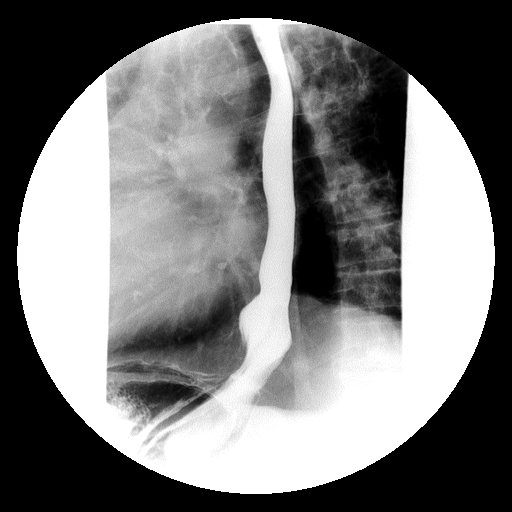

[Series 17: run · 1 of 1 slices shown (13 of 19)]
[im 1/1]
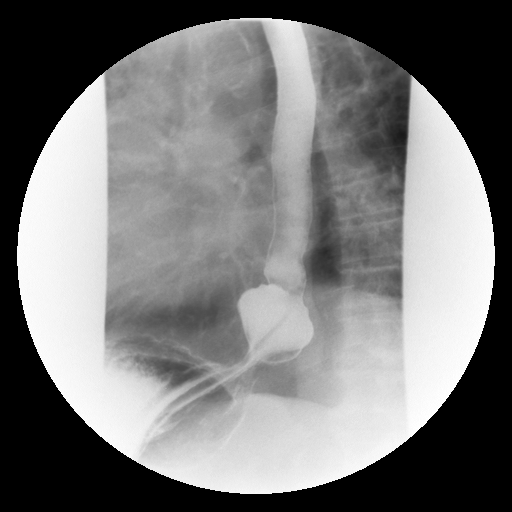

[Series 20: run · 1 of 1 slices shown (14 of 19)]
[im 1/1]
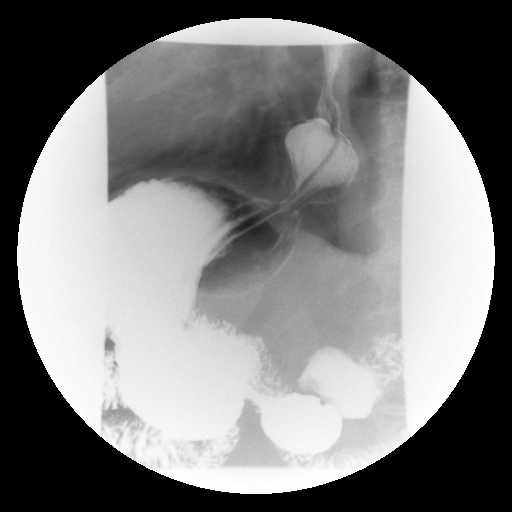

[Series 21: run · 1 of 1 slices shown (15 of 19)]
[im 1/1]
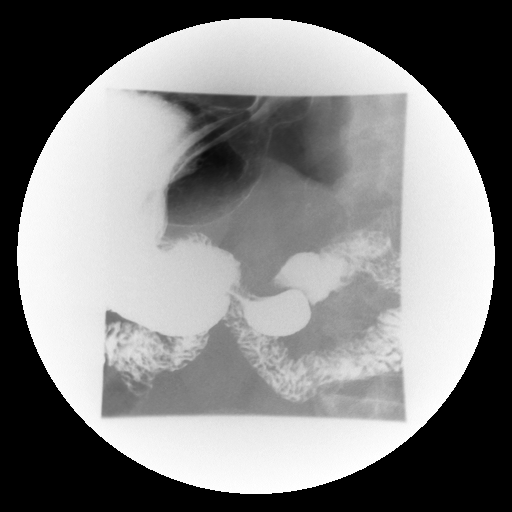

[Series 23: run · 1 of 1 slices shown (16 of 19)]
[im 1/1]
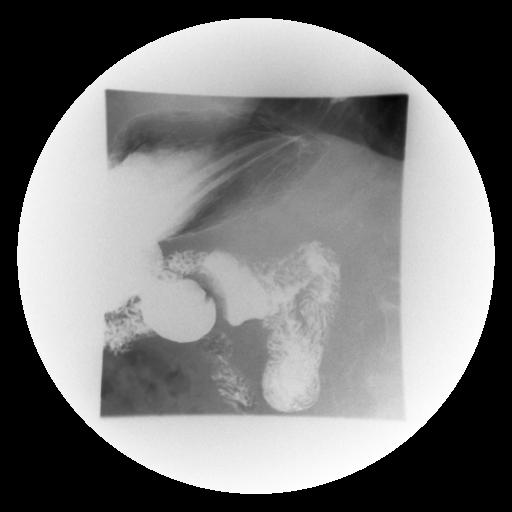

[Series 24: run · 1 of 1 slices shown (17 of 19)]
[im 1/1]
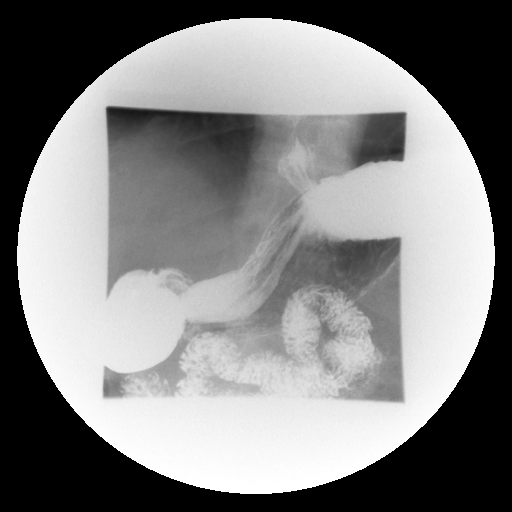

[Series 26: run · 1 of 1 slices shown (18 of 19)]
[im 1/1]
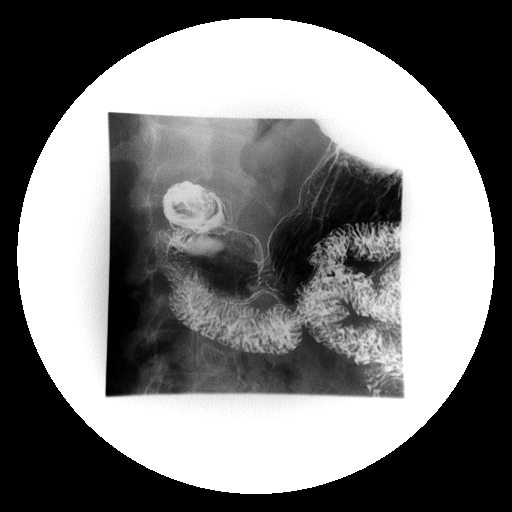

[Series 27: run · 1 of 1 slices shown (19 of 19)]
[im 1/1]
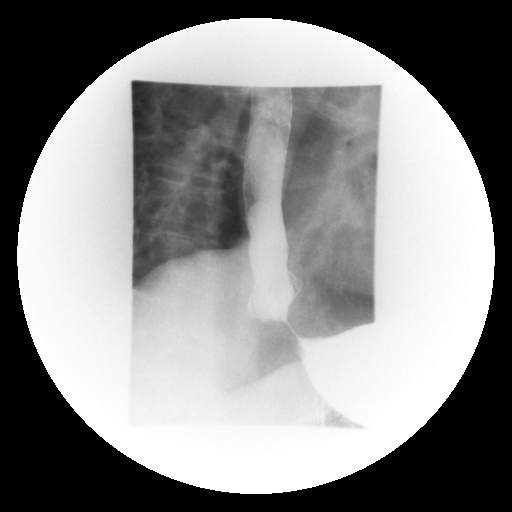

[19 of 24 positions shown; findings below may reference images not displayed]

FINDINGS: A preliminary film of the abdomen shows a nonspecific bowel gas
pattern. Calcifications overlie the left renal hilus which may
represent renovascular calcification. There is some degenerative
change in the lower lumbar spine.

A double contrast upper GI was performed. The mucosa of the
esophagus is unremarkable. A single contrast study shows the
swallowing mechanism to be normal. There is a small hiatal hernia
present. Moderate gastroesophageal reflux is demonstrated. A barium
pill was given at the end of the study which passed into the stomach
without delay.

The stomach is otherwise normal in contour and peristalsis. The
duodenal bulb fills and the duodenal loop is in normal position.
IMPRESSION: 1. Small hiatal hernia with moderate gastroesophageal reflux. Barium
pill passes into the stomach without delay.
2. The stomach and duodenum are otherwise unremarkable.

## 2016-12-27 DIAGNOSIS — F17210 Nicotine dependence, cigarettes, uncomplicated: Secondary | ICD-10-CM | POA: Diagnosis not present

## 2016-12-27 DIAGNOSIS — N183 Chronic kidney disease, stage 3 (moderate): Secondary | ICD-10-CM | POA: Diagnosis not present

## 2016-12-27 DIAGNOSIS — E119 Type 2 diabetes mellitus without complications: Secondary | ICD-10-CM | POA: Diagnosis not present

## 2016-12-27 DIAGNOSIS — E7800 Pure hypercholesterolemia, unspecified: Secondary | ICD-10-CM | POA: Diagnosis not present

## 2016-12-27 DIAGNOSIS — E1165 Type 2 diabetes mellitus with hyperglycemia: Secondary | ICD-10-CM | POA: Diagnosis not present

## 2016-12-27 DIAGNOSIS — I739 Peripheral vascular disease, unspecified: Secondary | ICD-10-CM | POA: Diagnosis not present

## 2016-12-27 DIAGNOSIS — G459 Transient cerebral ischemic attack, unspecified: Secondary | ICD-10-CM | POA: Diagnosis not present

## 2016-12-27 DIAGNOSIS — I10 Essential (primary) hypertension: Secondary | ICD-10-CM | POA: Diagnosis not present

## 2017-01-29 DIAGNOSIS — I10 Essential (primary) hypertension: Secondary | ICD-10-CM | POA: Diagnosis not present

## 2017-01-29 DIAGNOSIS — N400 Enlarged prostate without lower urinary tract symptoms: Secondary | ICD-10-CM | POA: Diagnosis not present

## 2017-01-29 DIAGNOSIS — G459 Transient cerebral ischemic attack, unspecified: Secondary | ICD-10-CM | POA: Diagnosis not present

## 2017-01-29 DIAGNOSIS — N183 Chronic kidney disease, stage 3 (moderate): Secondary | ICD-10-CM | POA: Diagnosis not present

## 2017-01-29 DIAGNOSIS — E782 Mixed hyperlipidemia: Secondary | ICD-10-CM | POA: Diagnosis not present

## 2017-01-29 DIAGNOSIS — E119 Type 2 diabetes mellitus without complications: Secondary | ICD-10-CM | POA: Diagnosis not present

## 2017-02-13 DIAGNOSIS — E782 Mixed hyperlipidemia: Secondary | ICD-10-CM | POA: Diagnosis not present

## 2017-02-13 DIAGNOSIS — G459 Transient cerebral ischemic attack, unspecified: Secondary | ICD-10-CM | POA: Diagnosis not present

## 2017-02-13 DIAGNOSIS — E119 Type 2 diabetes mellitus without complications: Secondary | ICD-10-CM | POA: Diagnosis not present

## 2017-02-13 DIAGNOSIS — N400 Enlarged prostate without lower urinary tract symptoms: Secondary | ICD-10-CM | POA: Diagnosis not present

## 2017-02-13 DIAGNOSIS — N183 Chronic kidney disease, stage 3 (moderate): Secondary | ICD-10-CM | POA: Diagnosis not present

## 2017-02-13 DIAGNOSIS — I10 Essential (primary) hypertension: Secondary | ICD-10-CM | POA: Diagnosis not present

## 2017-02-27 DIAGNOSIS — Z961 Presence of intraocular lens: Secondary | ICD-10-CM | POA: Diagnosis not present

## 2017-04-25 DIAGNOSIS — R3912 Poor urinary stream: Secondary | ICD-10-CM | POA: Diagnosis not present

## 2017-04-25 DIAGNOSIS — N401 Enlarged prostate with lower urinary tract symptoms: Secondary | ICD-10-CM | POA: Diagnosis not present

## 2017-04-25 DIAGNOSIS — R3911 Hesitancy of micturition: Secondary | ICD-10-CM | POA: Diagnosis not present

## 2017-04-25 DIAGNOSIS — R3914 Feeling of incomplete bladder emptying: Secondary | ICD-10-CM | POA: Diagnosis not present

## 2017-06-25 DIAGNOSIS — R195 Other fecal abnormalities: Secondary | ICD-10-CM | POA: Diagnosis not present

## 2017-06-25 DIAGNOSIS — K921 Melena: Secondary | ICD-10-CM | POA: Diagnosis not present

## 2017-06-25 DIAGNOSIS — K922 Gastrointestinal hemorrhage, unspecified: Secondary | ICD-10-CM | POA: Diagnosis not present

## 2017-06-29 DIAGNOSIS — R195 Other fecal abnormalities: Secondary | ICD-10-CM | POA: Diagnosis not present

## 2017-06-29 DIAGNOSIS — Z86010 Personal history of colonic polyps: Secondary | ICD-10-CM | POA: Diagnosis not present

## 2017-06-29 DIAGNOSIS — K921 Melena: Secondary | ICD-10-CM | POA: Diagnosis not present

## 2017-07-06 DIAGNOSIS — K449 Diaphragmatic hernia without obstruction or gangrene: Secondary | ICD-10-CM | POA: Diagnosis not present

## 2017-07-06 DIAGNOSIS — K2270 Barrett's esophagus without dysplasia: Secondary | ICD-10-CM | POA: Diagnosis not present

## 2017-07-06 DIAGNOSIS — K2930 Chronic superficial gastritis without bleeding: Secondary | ICD-10-CM | POA: Diagnosis not present

## 2017-07-06 DIAGNOSIS — K921 Melena: Secondary | ICD-10-CM | POA: Diagnosis not present

## 2017-07-06 DIAGNOSIS — K2950 Unspecified chronic gastritis without bleeding: Secondary | ICD-10-CM | POA: Diagnosis not present

## 2017-07-09 DIAGNOSIS — G459 Transient cerebral ischemic attack, unspecified: Secondary | ICD-10-CM | POA: Diagnosis not present

## 2017-07-09 DIAGNOSIS — Z0000 Encounter for general adult medical examination without abnormal findings: Secondary | ICD-10-CM | POA: Diagnosis not present

## 2017-07-09 DIAGNOSIS — F17210 Nicotine dependence, cigarettes, uncomplicated: Secondary | ICD-10-CM | POA: Diagnosis not present

## 2017-07-09 DIAGNOSIS — I739 Peripheral vascular disease, unspecified: Secondary | ICD-10-CM | POA: Diagnosis not present

## 2017-07-09 DIAGNOSIS — E7800 Pure hypercholesterolemia, unspecified: Secondary | ICD-10-CM | POA: Diagnosis not present

## 2017-07-09 DIAGNOSIS — E1122 Type 2 diabetes mellitus with diabetic chronic kidney disease: Secondary | ICD-10-CM | POA: Diagnosis not present

## 2017-07-09 DIAGNOSIS — I779 Disorder of arteries and arterioles, unspecified: Secondary | ICD-10-CM | POA: Diagnosis not present

## 2017-07-09 DIAGNOSIS — E663 Overweight: Secondary | ICD-10-CM | POA: Diagnosis not present

## 2017-07-09 DIAGNOSIS — N183 Chronic kidney disease, stage 3 (moderate): Secondary | ICD-10-CM | POA: Diagnosis not present

## 2017-07-11 DIAGNOSIS — K2930 Chronic superficial gastritis without bleeding: Secondary | ICD-10-CM | POA: Diagnosis not present

## 2017-07-11 DIAGNOSIS — K2270 Barrett's esophagus without dysplasia: Secondary | ICD-10-CM | POA: Diagnosis not present

## 2017-07-27 DIAGNOSIS — R195 Other fecal abnormalities: Secondary | ICD-10-CM | POA: Diagnosis not present

## 2017-07-27 DIAGNOSIS — Z86010 Personal history of colonic polyps: Secondary | ICD-10-CM | POA: Diagnosis not present

## 2017-08-15 DIAGNOSIS — Z86010 Personal history of colonic polyps: Secondary | ICD-10-CM | POA: Diagnosis not present

## 2017-08-15 DIAGNOSIS — K635 Polyp of colon: Secondary | ICD-10-CM | POA: Diagnosis not present

## 2017-08-15 DIAGNOSIS — K5730 Diverticulosis of large intestine without perforation or abscess without bleeding: Secondary | ICD-10-CM | POA: Diagnosis not present

## 2017-08-15 DIAGNOSIS — D126 Benign neoplasm of colon, unspecified: Secondary | ICD-10-CM | POA: Diagnosis not present

## 2017-08-15 DIAGNOSIS — K5520 Angiodysplasia of colon without hemorrhage: Secondary | ICD-10-CM | POA: Diagnosis not present

## 2017-08-20 DIAGNOSIS — G459 Transient cerebral ischemic attack, unspecified: Secondary | ICD-10-CM | POA: Diagnosis not present

## 2017-08-20 DIAGNOSIS — I10 Essential (primary) hypertension: Secondary | ICD-10-CM | POA: Diagnosis not present

## 2017-08-20 DIAGNOSIS — N183 Chronic kidney disease, stage 3 (moderate): Secondary | ICD-10-CM | POA: Diagnosis not present

## 2017-08-20 DIAGNOSIS — N400 Enlarged prostate without lower urinary tract symptoms: Secondary | ICD-10-CM | POA: Diagnosis not present

## 2017-08-20 DIAGNOSIS — E1151 Type 2 diabetes mellitus with diabetic peripheral angiopathy without gangrene: Secondary | ICD-10-CM | POA: Diagnosis not present

## 2017-08-20 DIAGNOSIS — E1122 Type 2 diabetes mellitus with diabetic chronic kidney disease: Secondary | ICD-10-CM | POA: Diagnosis not present

## 2017-08-20 DIAGNOSIS — E782 Mixed hyperlipidemia: Secondary | ICD-10-CM | POA: Diagnosis not present

## 2017-08-21 DIAGNOSIS — D126 Benign neoplasm of colon, unspecified: Secondary | ICD-10-CM | POA: Diagnosis not present

## 2017-08-21 DIAGNOSIS — K635 Polyp of colon: Secondary | ICD-10-CM | POA: Diagnosis not present

## 2017-09-13 DIAGNOSIS — Z961 Presence of intraocular lens: Secondary | ICD-10-CM | POA: Diagnosis not present

## 2017-09-13 DIAGNOSIS — E119 Type 2 diabetes mellitus without complications: Secondary | ICD-10-CM | POA: Diagnosis not present

## 2017-12-11 ENCOUNTER — Other Ambulatory Visit: Payer: Self-pay | Admitting: Gastroenterology

## 2017-12-11 DIAGNOSIS — D500 Iron deficiency anemia secondary to blood loss (chronic): Secondary | ICD-10-CM | POA: Diagnosis not present

## 2017-12-11 DIAGNOSIS — R195 Other fecal abnormalities: Secondary | ICD-10-CM | POA: Diagnosis not present

## 2017-12-11 DIAGNOSIS — K921 Melena: Secondary | ICD-10-CM | POA: Diagnosis not present

## 2017-12-20 ENCOUNTER — Ambulatory Visit
Admission: RE | Admit: 2017-12-20 | Discharge: 2017-12-20 | Disposition: A | Discharge status: Home / Self Care | Payer: Commercial Managed Care - HMO | Source: Ambulatory Visit | Attending: Gastroenterology | Admitting: Gastroenterology

## 2017-12-20 DIAGNOSIS — K5730 Diverticulosis of large intestine without perforation or abscess without bleeding: Secondary | ICD-10-CM | POA: Diagnosis not present

## 2017-12-20 DIAGNOSIS — D500 Iron deficiency anemia secondary to blood loss (chronic): Secondary | ICD-10-CM

## 2017-12-20 MED ORDER — IOPAMIDOL (ISOVUE-300) INJECTION 61%
100.0000 mL | Freq: Once | INTRAVENOUS | Status: AC | PRN
  Administered 2017-12-20: 100 mL via INTRAVENOUS

## 2018-01-08 DIAGNOSIS — G459 Transient cerebral ischemic attack, unspecified: Secondary | ICD-10-CM | POA: Diagnosis not present

## 2018-01-08 DIAGNOSIS — I739 Peripheral vascular disease, unspecified: Secondary | ICD-10-CM | POA: Diagnosis not present

## 2018-01-08 DIAGNOSIS — D500 Iron deficiency anemia secondary to blood loss (chronic): Secondary | ICD-10-CM | POA: Diagnosis not present

## 2018-01-08 DIAGNOSIS — I779 Disorder of arteries and arterioles, unspecified: Secondary | ICD-10-CM | POA: Diagnosis not present

## 2018-01-08 DIAGNOSIS — F17210 Nicotine dependence, cigarettes, uncomplicated: Secondary | ICD-10-CM | POA: Diagnosis not present

## 2018-01-08 DIAGNOSIS — N183 Chronic kidney disease, stage 3 (moderate): Secondary | ICD-10-CM | POA: Diagnosis not present

## 2018-01-08 DIAGNOSIS — E1122 Type 2 diabetes mellitus with diabetic chronic kidney disease: Secondary | ICD-10-CM | POA: Diagnosis not present

## 2018-01-08 DIAGNOSIS — E7800 Pure hypercholesterolemia, unspecified: Secondary | ICD-10-CM | POA: Diagnosis not present

## 2018-01-11 DIAGNOSIS — R195 Other fecal abnormalities: Secondary | ICD-10-CM | POA: Diagnosis not present

## 2018-01-11 DIAGNOSIS — K921 Melena: Secondary | ICD-10-CM | POA: Diagnosis not present

## 2018-02-01 DIAGNOSIS — D500 Iron deficiency anemia secondary to blood loss (chronic): Secondary | ICD-10-CM | POA: Diagnosis not present

## 2018-02-01 DIAGNOSIS — E1151 Type 2 diabetes mellitus with diabetic peripheral angiopathy without gangrene: Secondary | ICD-10-CM | POA: Diagnosis not present

## 2018-02-01 DIAGNOSIS — E1122 Type 2 diabetes mellitus with diabetic chronic kidney disease: Secondary | ICD-10-CM | POA: Diagnosis not present

## 2018-02-01 DIAGNOSIS — E782 Mixed hyperlipidemia: Secondary | ICD-10-CM | POA: Diagnosis not present

## 2018-02-01 DIAGNOSIS — I10 Essential (primary) hypertension: Secondary | ICD-10-CM | POA: Diagnosis not present

## 2018-02-01 DIAGNOSIS — N183 Chronic kidney disease, stage 3 (moderate): Secondary | ICD-10-CM | POA: Diagnosis not present

## 2018-02-01 DIAGNOSIS — G459 Transient cerebral ischemic attack, unspecified: Secondary | ICD-10-CM | POA: Diagnosis not present

## 2018-02-01 DIAGNOSIS — N400 Enlarged prostate without lower urinary tract symptoms: Secondary | ICD-10-CM | POA: Diagnosis not present

## 2018-02-18 DIAGNOSIS — R3912 Poor urinary stream: Secondary | ICD-10-CM | POA: Diagnosis not present

## 2018-02-18 DIAGNOSIS — R351 Nocturia: Secondary | ICD-10-CM | POA: Diagnosis not present

## 2018-02-18 DIAGNOSIS — N401 Enlarged prostate with lower urinary tract symptoms: Secondary | ICD-10-CM | POA: Diagnosis not present

## 2018-04-04 DIAGNOSIS — N401 Enlarged prostate with lower urinary tract symptoms: Secondary | ICD-10-CM | POA: Diagnosis not present

## 2018-04-04 DIAGNOSIS — R351 Nocturia: Secondary | ICD-10-CM | POA: Diagnosis not present

## 2018-05-20 DIAGNOSIS — R3914 Feeling of incomplete bladder emptying: Secondary | ICD-10-CM | POA: Diagnosis not present

## 2018-05-20 DIAGNOSIS — N401 Enlarged prostate with lower urinary tract symptoms: Secondary | ICD-10-CM | POA: Diagnosis not present

## 2018-05-27 DIAGNOSIS — D500 Iron deficiency anemia secondary to blood loss (chronic): Secondary | ICD-10-CM | POA: Diagnosis not present

## 2018-05-27 DIAGNOSIS — G459 Transient cerebral ischemic attack, unspecified: Secondary | ICD-10-CM | POA: Diagnosis not present

## 2018-05-27 DIAGNOSIS — E119 Type 2 diabetes mellitus without complications: Secondary | ICD-10-CM | POA: Diagnosis not present

## 2018-05-27 DIAGNOSIS — E782 Mixed hyperlipidemia: Secondary | ICD-10-CM | POA: Diagnosis not present

## 2018-05-27 DIAGNOSIS — E1122 Type 2 diabetes mellitus with diabetic chronic kidney disease: Secondary | ICD-10-CM | POA: Diagnosis not present

## 2018-05-27 DIAGNOSIS — I10 Essential (primary) hypertension: Secondary | ICD-10-CM | POA: Diagnosis not present

## 2018-05-27 DIAGNOSIS — N183 Chronic kidney disease, stage 3 (moderate): Secondary | ICD-10-CM | POA: Diagnosis not present

## 2018-05-27 DIAGNOSIS — E1151 Type 2 diabetes mellitus with diabetic peripheral angiopathy without gangrene: Secondary | ICD-10-CM | POA: Diagnosis not present

## 2018-05-27 DIAGNOSIS — N400 Enlarged prostate without lower urinary tract symptoms: Secondary | ICD-10-CM | POA: Diagnosis not present

## 2018-08-14 DIAGNOSIS — N183 Chronic kidney disease, stage 3 (moderate): Secondary | ICD-10-CM | POA: Diagnosis not present

## 2018-08-14 DIAGNOSIS — E1122 Type 2 diabetes mellitus with diabetic chronic kidney disease: Secondary | ICD-10-CM | POA: Diagnosis not present

## 2018-08-14 DIAGNOSIS — Z0000 Encounter for general adult medical examination without abnormal findings: Secondary | ICD-10-CM | POA: Diagnosis not present

## 2018-08-14 DIAGNOSIS — N400 Enlarged prostate without lower urinary tract symptoms: Secondary | ICD-10-CM | POA: Diagnosis not present

## 2018-08-14 DIAGNOSIS — I779 Disorder of arteries and arterioles, unspecified: Secondary | ICD-10-CM | POA: Diagnosis not present

## 2018-08-14 DIAGNOSIS — Z1389 Encounter for screening for other disorder: Secondary | ICD-10-CM | POA: Diagnosis not present

## 2018-08-14 DIAGNOSIS — E7800 Pure hypercholesterolemia, unspecified: Secondary | ICD-10-CM | POA: Diagnosis not present

## 2018-08-14 DIAGNOSIS — E1151 Type 2 diabetes mellitus with diabetic peripheral angiopathy without gangrene: Secondary | ICD-10-CM | POA: Diagnosis not present

## 2018-08-14 DIAGNOSIS — F17210 Nicotine dependence, cigarettes, uncomplicated: Secondary | ICD-10-CM | POA: Diagnosis not present

## 2018-11-13 DIAGNOSIS — Z961 Presence of intraocular lens: Secondary | ICD-10-CM | POA: Diagnosis not present

## 2018-11-13 DIAGNOSIS — H02834 Dermatochalasis of left upper eyelid: Secondary | ICD-10-CM | POA: Diagnosis not present

## 2018-11-13 DIAGNOSIS — H04123 Dry eye syndrome of bilateral lacrimal glands: Secondary | ICD-10-CM | POA: Diagnosis not present

## 2018-11-13 DIAGNOSIS — E119 Type 2 diabetes mellitus without complications: Secondary | ICD-10-CM | POA: Diagnosis not present

## 2018-11-13 DIAGNOSIS — H5010 Unspecified exotropia: Secondary | ICD-10-CM | POA: Diagnosis not present

## 2018-11-13 DIAGNOSIS — H02831 Dermatochalasis of right upper eyelid: Secondary | ICD-10-CM | POA: Diagnosis not present

## 2018-11-20 DIAGNOSIS — N401 Enlarged prostate with lower urinary tract symptoms: Secondary | ICD-10-CM | POA: Diagnosis not present

## 2018-11-20 DIAGNOSIS — R351 Nocturia: Secondary | ICD-10-CM | POA: Diagnosis not present

## 2019-02-13 DIAGNOSIS — E1151 Type 2 diabetes mellitus with diabetic peripheral angiopathy without gangrene: Secondary | ICD-10-CM | POA: Diagnosis not present

## 2019-02-13 DIAGNOSIS — K2270 Barrett's esophagus without dysplasia: Secondary | ICD-10-CM | POA: Diagnosis not present

## 2019-02-13 DIAGNOSIS — N183 Chronic kidney disease, stage 3 unspecified: Secondary | ICD-10-CM | POA: Diagnosis not present

## 2019-02-13 DIAGNOSIS — E1122 Type 2 diabetes mellitus with diabetic chronic kidney disease: Secondary | ICD-10-CM | POA: Diagnosis not present

## 2019-02-13 DIAGNOSIS — K921 Melena: Secondary | ICD-10-CM | POA: Diagnosis not present

## 2019-02-13 DIAGNOSIS — I779 Disorder of arteries and arterioles, unspecified: Secondary | ICD-10-CM | POA: Diagnosis not present

## 2019-02-13 DIAGNOSIS — R5383 Other fatigue: Secondary | ICD-10-CM | POA: Diagnosis not present

## 2019-02-13 DIAGNOSIS — E7800 Pure hypercholesterolemia, unspecified: Secondary | ICD-10-CM | POA: Diagnosis not present

## 2019-02-13 DIAGNOSIS — D500 Iron deficiency anemia secondary to blood loss (chronic): Secondary | ICD-10-CM | POA: Diagnosis not present

## 2019-02-13 DIAGNOSIS — I739 Peripheral vascular disease, unspecified: Secondary | ICD-10-CM | POA: Diagnosis not present

## 2019-02-13 DIAGNOSIS — N1831 Chronic kidney disease, stage 3a: Secondary | ICD-10-CM | POA: Diagnosis not present

## 2019-02-13 DIAGNOSIS — F17210 Nicotine dependence, cigarettes, uncomplicated: Secondary | ICD-10-CM | POA: Diagnosis not present

## 2019-06-10 DIAGNOSIS — E1151 Type 2 diabetes mellitus with diabetic peripheral angiopathy without gangrene: Secondary | ICD-10-CM | POA: Diagnosis not present

## 2019-06-10 DIAGNOSIS — D500 Iron deficiency anemia secondary to blood loss (chronic): Secondary | ICD-10-CM | POA: Diagnosis not present

## 2019-06-10 DIAGNOSIS — I10 Essential (primary) hypertension: Secondary | ICD-10-CM | POA: Diagnosis not present

## 2019-06-10 DIAGNOSIS — E782 Mixed hyperlipidemia: Secondary | ICD-10-CM | POA: Diagnosis not present

## 2019-06-10 DIAGNOSIS — G459 Transient cerebral ischemic attack, unspecified: Secondary | ICD-10-CM | POA: Diagnosis not present

## 2019-06-10 DIAGNOSIS — N1831 Chronic kidney disease, stage 3a: Secondary | ICD-10-CM | POA: Diagnosis not present

## 2019-06-10 DIAGNOSIS — E119 Type 2 diabetes mellitus without complications: Secondary | ICD-10-CM | POA: Diagnosis not present

## 2019-06-10 DIAGNOSIS — N400 Enlarged prostate without lower urinary tract symptoms: Secondary | ICD-10-CM | POA: Diagnosis not present

## 2019-06-10 DIAGNOSIS — E1122 Type 2 diabetes mellitus with diabetic chronic kidney disease: Secondary | ICD-10-CM | POA: Diagnosis not present

## 2019-07-25 DIAGNOSIS — Z1159 Encounter for screening for other viral diseases: Secondary | ICD-10-CM | POA: Diagnosis not present

## 2019-07-30 DIAGNOSIS — K449 Diaphragmatic hernia without obstruction or gangrene: Secondary | ICD-10-CM | POA: Diagnosis not present

## 2019-07-30 DIAGNOSIS — K2270 Barrett's esophagus without dysplasia: Secondary | ICD-10-CM | POA: Diagnosis not present

## 2019-08-01 DIAGNOSIS — K2270 Barrett's esophagus without dysplasia: Secondary | ICD-10-CM | POA: Diagnosis not present

## 2019-08-28 DIAGNOSIS — Z0000 Encounter for general adult medical examination without abnormal findings: Secondary | ICD-10-CM | POA: Diagnosis not present

## 2019-08-28 DIAGNOSIS — Z716 Tobacco abuse counseling: Secondary | ICD-10-CM | POA: Diagnosis not present

## 2019-08-28 DIAGNOSIS — I779 Disorder of arteries and arterioles, unspecified: Secondary | ICD-10-CM | POA: Diagnosis not present

## 2019-08-28 DIAGNOSIS — E7800 Pure hypercholesterolemia, unspecified: Secondary | ICD-10-CM | POA: Diagnosis not present

## 2019-08-28 DIAGNOSIS — K2270 Barrett's esophagus without dysplasia: Secondary | ICD-10-CM | POA: Diagnosis not present

## 2019-08-28 DIAGNOSIS — F17210 Nicotine dependence, cigarettes, uncomplicated: Secondary | ICD-10-CM | POA: Diagnosis not present

## 2019-08-28 DIAGNOSIS — G459 Transient cerebral ischemic attack, unspecified: Secondary | ICD-10-CM | POA: Diagnosis not present

## 2019-08-28 DIAGNOSIS — N400 Enlarged prostate without lower urinary tract symptoms: Secondary | ICD-10-CM | POA: Diagnosis not present

## 2019-08-28 DIAGNOSIS — Z1389 Encounter for screening for other disorder: Secondary | ICD-10-CM | POA: Diagnosis not present

## 2019-08-28 DIAGNOSIS — E1151 Type 2 diabetes mellitus with diabetic peripheral angiopathy without gangrene: Secondary | ICD-10-CM | POA: Diagnosis not present

## 2019-08-28 DIAGNOSIS — I739 Peripheral vascular disease, unspecified: Secondary | ICD-10-CM | POA: Diagnosis not present

## 2019-08-28 DIAGNOSIS — Z23 Encounter for immunization: Secondary | ICD-10-CM | POA: Diagnosis not present

## 2019-09-26 DIAGNOSIS — N400 Enlarged prostate without lower urinary tract symptoms: Secondary | ICD-10-CM | POA: Diagnosis not present

## 2019-09-26 DIAGNOSIS — D500 Iron deficiency anemia secondary to blood loss (chronic): Secondary | ICD-10-CM | POA: Diagnosis not present

## 2019-09-26 DIAGNOSIS — G459 Transient cerebral ischemic attack, unspecified: Secondary | ICD-10-CM | POA: Diagnosis not present

## 2019-09-26 DIAGNOSIS — E119 Type 2 diabetes mellitus without complications: Secondary | ICD-10-CM | POA: Diagnosis not present

## 2019-09-26 DIAGNOSIS — E1122 Type 2 diabetes mellitus with diabetic chronic kidney disease: Secondary | ICD-10-CM | POA: Diagnosis not present

## 2019-09-26 DIAGNOSIS — E1151 Type 2 diabetes mellitus with diabetic peripheral angiopathy without gangrene: Secondary | ICD-10-CM | POA: Diagnosis not present

## 2019-09-26 DIAGNOSIS — E7800 Pure hypercholesterolemia, unspecified: Secondary | ICD-10-CM | POA: Diagnosis not present

## 2019-09-26 DIAGNOSIS — I10 Essential (primary) hypertension: Secondary | ICD-10-CM | POA: Diagnosis not present

## 2019-09-26 DIAGNOSIS — E782 Mixed hyperlipidemia: Secondary | ICD-10-CM | POA: Diagnosis not present

## 2019-11-18 DIAGNOSIS — E119 Type 2 diabetes mellitus without complications: Secondary | ICD-10-CM | POA: Diagnosis not present

## 2019-11-18 DIAGNOSIS — H04123 Dry eye syndrome of bilateral lacrimal glands: Secondary | ICD-10-CM | POA: Diagnosis not present

## 2019-11-18 DIAGNOSIS — Z961 Presence of intraocular lens: Secondary | ICD-10-CM | POA: Diagnosis not present

## 2019-11-18 DIAGNOSIS — H02834 Dermatochalasis of left upper eyelid: Secondary | ICD-10-CM | POA: Diagnosis not present

## 2019-11-18 DIAGNOSIS — H5010 Unspecified exotropia: Secondary | ICD-10-CM | POA: Diagnosis not present

## 2019-11-18 DIAGNOSIS — H02831 Dermatochalasis of right upper eyelid: Secondary | ICD-10-CM | POA: Diagnosis not present

## 2019-12-03 DIAGNOSIS — I779 Disorder of arteries and arterioles, unspecified: Secondary | ICD-10-CM | POA: Diagnosis not present

## 2019-12-03 DIAGNOSIS — F17210 Nicotine dependence, cigarettes, uncomplicated: Secondary | ICD-10-CM | POA: Diagnosis not present

## 2019-12-03 DIAGNOSIS — E782 Mixed hyperlipidemia: Secondary | ICD-10-CM | POA: Diagnosis not present

## 2019-12-03 DIAGNOSIS — N1831 Chronic kidney disease, stage 3a: Secondary | ICD-10-CM | POA: Diagnosis not present

## 2019-12-03 DIAGNOSIS — K2270 Barrett's esophagus without dysplasia: Secondary | ICD-10-CM | POA: Diagnosis not present

## 2019-12-03 DIAGNOSIS — Z7984 Long term (current) use of oral hypoglycemic drugs: Secondary | ICD-10-CM | POA: Diagnosis not present

## 2019-12-03 DIAGNOSIS — I10 Essential (primary) hypertension: Secondary | ICD-10-CM | POA: Diagnosis not present

## 2019-12-03 DIAGNOSIS — I739 Peripheral vascular disease, unspecified: Secondary | ICD-10-CM | POA: Diagnosis not present

## 2019-12-03 DIAGNOSIS — E119 Type 2 diabetes mellitus without complications: Secondary | ICD-10-CM | POA: Diagnosis not present

## 2019-12-08 DIAGNOSIS — N400 Enlarged prostate without lower urinary tract symptoms: Secondary | ICD-10-CM | POA: Diagnosis not present

## 2019-12-08 DIAGNOSIS — E7800 Pure hypercholesterolemia, unspecified: Secondary | ICD-10-CM | POA: Diagnosis not present

## 2019-12-08 DIAGNOSIS — I10 Essential (primary) hypertension: Secondary | ICD-10-CM | POA: Diagnosis not present

## 2019-12-08 DIAGNOSIS — G459 Transient cerebral ischemic attack, unspecified: Secondary | ICD-10-CM | POA: Diagnosis not present

## 2019-12-08 DIAGNOSIS — E1151 Type 2 diabetes mellitus with diabetic peripheral angiopathy without gangrene: Secondary | ICD-10-CM | POA: Diagnosis not present

## 2019-12-08 DIAGNOSIS — E119 Type 2 diabetes mellitus without complications: Secondary | ICD-10-CM | POA: Diagnosis not present

## 2019-12-08 DIAGNOSIS — D500 Iron deficiency anemia secondary to blood loss (chronic): Secondary | ICD-10-CM | POA: Diagnosis not present

## 2019-12-08 DIAGNOSIS — E782 Mixed hyperlipidemia: Secondary | ICD-10-CM | POA: Diagnosis not present

## 2019-12-08 DIAGNOSIS — E1122 Type 2 diabetes mellitus with diabetic chronic kidney disease: Secondary | ICD-10-CM | POA: Diagnosis not present

## 2020-01-12 DIAGNOSIS — E782 Mixed hyperlipidemia: Secondary | ICD-10-CM | POA: Diagnosis not present

## 2020-01-12 DIAGNOSIS — G459 Transient cerebral ischemic attack, unspecified: Secondary | ICD-10-CM | POA: Diagnosis not present

## 2020-01-12 DIAGNOSIS — E1122 Type 2 diabetes mellitus with diabetic chronic kidney disease: Secondary | ICD-10-CM | POA: Diagnosis not present

## 2020-01-12 DIAGNOSIS — N400 Enlarged prostate without lower urinary tract symptoms: Secondary | ICD-10-CM | POA: Diagnosis not present

## 2020-01-12 DIAGNOSIS — E119 Type 2 diabetes mellitus without complications: Secondary | ICD-10-CM | POA: Diagnosis not present

## 2020-01-12 DIAGNOSIS — E7800 Pure hypercholesterolemia, unspecified: Secondary | ICD-10-CM | POA: Diagnosis not present

## 2020-01-12 DIAGNOSIS — I10 Essential (primary) hypertension: Secondary | ICD-10-CM | POA: Diagnosis not present

## 2020-01-12 DIAGNOSIS — D500 Iron deficiency anemia secondary to blood loss (chronic): Secondary | ICD-10-CM | POA: Diagnosis not present

## 2020-01-12 DIAGNOSIS — E1151 Type 2 diabetes mellitus with diabetic peripheral angiopathy without gangrene: Secondary | ICD-10-CM | POA: Diagnosis not present

## 2020-02-09 DIAGNOSIS — I10 Essential (primary) hypertension: Secondary | ICD-10-CM | POA: Diagnosis not present

## 2020-02-09 DIAGNOSIS — E1122 Type 2 diabetes mellitus with diabetic chronic kidney disease: Secondary | ICD-10-CM | POA: Diagnosis not present

## 2020-02-09 DIAGNOSIS — E119 Type 2 diabetes mellitus without complications: Secondary | ICD-10-CM | POA: Diagnosis not present

## 2020-02-09 DIAGNOSIS — E782 Mixed hyperlipidemia: Secondary | ICD-10-CM | POA: Diagnosis not present

## 2020-02-09 DIAGNOSIS — E7800 Pure hypercholesterolemia, unspecified: Secondary | ICD-10-CM | POA: Diagnosis not present

## 2020-02-09 DIAGNOSIS — D500 Iron deficiency anemia secondary to blood loss (chronic): Secondary | ICD-10-CM | POA: Diagnosis not present

## 2020-02-09 DIAGNOSIS — E1151 Type 2 diabetes mellitus with diabetic peripheral angiopathy without gangrene: Secondary | ICD-10-CM | POA: Diagnosis not present

## 2020-02-09 DIAGNOSIS — G459 Transient cerebral ischemic attack, unspecified: Secondary | ICD-10-CM | POA: Diagnosis not present

## 2020-02-09 DIAGNOSIS — N400 Enlarged prostate without lower urinary tract symptoms: Secondary | ICD-10-CM | POA: Diagnosis not present

## 2020-03-03 DIAGNOSIS — Z7984 Long term (current) use of oral hypoglycemic drugs: Secondary | ICD-10-CM | POA: Diagnosis not present

## 2020-03-03 DIAGNOSIS — E119 Type 2 diabetes mellitus without complications: Secondary | ICD-10-CM | POA: Diagnosis not present

## 2020-03-03 DIAGNOSIS — G459 Transient cerebral ischemic attack, unspecified: Secondary | ICD-10-CM | POA: Diagnosis not present

## 2020-03-03 DIAGNOSIS — I779 Disorder of arteries and arterioles, unspecified: Secondary | ICD-10-CM | POA: Diagnosis not present

## 2020-03-03 DIAGNOSIS — Z23 Encounter for immunization: Secondary | ICD-10-CM | POA: Diagnosis not present

## 2020-03-03 DIAGNOSIS — F17210 Nicotine dependence, cigarettes, uncomplicated: Secondary | ICD-10-CM | POA: Diagnosis not present

## 2020-03-03 DIAGNOSIS — I10 Essential (primary) hypertension: Secondary | ICD-10-CM | POA: Diagnosis not present

## 2020-03-03 DIAGNOSIS — E1151 Type 2 diabetes mellitus with diabetic peripheral angiopathy without gangrene: Secondary | ICD-10-CM | POA: Diagnosis not present

## 2020-03-03 DIAGNOSIS — I739 Peripheral vascular disease, unspecified: Secondary | ICD-10-CM | POA: Diagnosis not present

## 2020-03-03 DIAGNOSIS — E7800 Pure hypercholesterolemia, unspecified: Secondary | ICD-10-CM | POA: Diagnosis not present

## 2020-03-03 DIAGNOSIS — N400 Enlarged prostate without lower urinary tract symptoms: Secondary | ICD-10-CM | POA: Diagnosis not present

## 2020-03-03 DIAGNOSIS — E782 Mixed hyperlipidemia: Secondary | ICD-10-CM | POA: Diagnosis not present

## 2020-03-03 DIAGNOSIS — D500 Iron deficiency anemia secondary to blood loss (chronic): Secondary | ICD-10-CM | POA: Diagnosis not present

## 2020-03-03 DIAGNOSIS — E1122 Type 2 diabetes mellitus with diabetic chronic kidney disease: Secondary | ICD-10-CM | POA: Diagnosis not present

## 2020-05-12 DIAGNOSIS — E782 Mixed hyperlipidemia: Secondary | ICD-10-CM | POA: Diagnosis not present

## 2020-05-12 DIAGNOSIS — E119 Type 2 diabetes mellitus without complications: Secondary | ICD-10-CM | POA: Diagnosis not present

## 2020-05-12 DIAGNOSIS — E1151 Type 2 diabetes mellitus with diabetic peripheral angiopathy without gangrene: Secondary | ICD-10-CM | POA: Diagnosis not present

## 2020-05-12 DIAGNOSIS — G459 Transient cerebral ischemic attack, unspecified: Secondary | ICD-10-CM | POA: Diagnosis not present

## 2020-05-12 DIAGNOSIS — E1122 Type 2 diabetes mellitus with diabetic chronic kidney disease: Secondary | ICD-10-CM | POA: Diagnosis not present

## 2020-05-12 DIAGNOSIS — I10 Essential (primary) hypertension: Secondary | ICD-10-CM | POA: Diagnosis not present

## 2020-05-12 DIAGNOSIS — E7800 Pure hypercholesterolemia, unspecified: Secondary | ICD-10-CM | POA: Diagnosis not present

## 2020-05-12 DIAGNOSIS — N400 Enlarged prostate without lower urinary tract symptoms: Secondary | ICD-10-CM | POA: Diagnosis not present

## 2020-05-12 DIAGNOSIS — D500 Iron deficiency anemia secondary to blood loss (chronic): Secondary | ICD-10-CM | POA: Diagnosis not present

## 2020-06-28 DIAGNOSIS — N183 Chronic kidney disease, stage 3 unspecified: Secondary | ICD-10-CM | POA: Diagnosis not present

## 2020-06-28 DIAGNOSIS — E782 Mixed hyperlipidemia: Secondary | ICD-10-CM | POA: Diagnosis not present

## 2020-06-28 DIAGNOSIS — E7800 Pure hypercholesterolemia, unspecified: Secondary | ICD-10-CM | POA: Diagnosis not present

## 2020-06-28 DIAGNOSIS — E1151 Type 2 diabetes mellitus with diabetic peripheral angiopathy without gangrene: Secondary | ICD-10-CM | POA: Diagnosis not present

## 2020-06-28 DIAGNOSIS — D500 Iron deficiency anemia secondary to blood loss (chronic): Secondary | ICD-10-CM | POA: Diagnosis not present

## 2020-06-28 DIAGNOSIS — E119 Type 2 diabetes mellitus without complications: Secondary | ICD-10-CM | POA: Diagnosis not present

## 2020-06-28 DIAGNOSIS — I10 Essential (primary) hypertension: Secondary | ICD-10-CM | POA: Diagnosis not present

## 2020-06-28 DIAGNOSIS — E1122 Type 2 diabetes mellitus with diabetic chronic kidney disease: Secondary | ICD-10-CM | POA: Diagnosis not present

## 2020-06-28 DIAGNOSIS — G459 Transient cerebral ischemic attack, unspecified: Secondary | ICD-10-CM | POA: Diagnosis not present

## 2020-08-30 DIAGNOSIS — E782 Mixed hyperlipidemia: Secondary | ICD-10-CM | POA: Diagnosis not present

## 2020-08-30 DIAGNOSIS — E1122 Type 2 diabetes mellitus with diabetic chronic kidney disease: Secondary | ICD-10-CM | POA: Diagnosis not present

## 2020-08-30 DIAGNOSIS — E7800 Pure hypercholesterolemia, unspecified: Secondary | ICD-10-CM | POA: Diagnosis not present

## 2020-08-30 DIAGNOSIS — J439 Emphysema, unspecified: Secondary | ICD-10-CM | POA: Diagnosis not present

## 2020-08-30 DIAGNOSIS — E1151 Type 2 diabetes mellitus with diabetic peripheral angiopathy without gangrene: Secondary | ICD-10-CM | POA: Diagnosis not present

## 2020-08-30 DIAGNOSIS — D500 Iron deficiency anemia secondary to blood loss (chronic): Secondary | ICD-10-CM | POA: Diagnosis not present

## 2020-08-30 DIAGNOSIS — N400 Enlarged prostate without lower urinary tract symptoms: Secondary | ICD-10-CM | POA: Diagnosis not present

## 2020-08-30 DIAGNOSIS — N183 Chronic kidney disease, stage 3 unspecified: Secondary | ICD-10-CM | POA: Diagnosis not present

## 2020-08-30 DIAGNOSIS — G459 Transient cerebral ischemic attack, unspecified: Secondary | ICD-10-CM | POA: Diagnosis not present

## 2020-09-01 DIAGNOSIS — J439 Emphysema, unspecified: Secondary | ICD-10-CM | POA: Diagnosis not present

## 2020-09-01 DIAGNOSIS — Z1389 Encounter for screening for other disorder: Secondary | ICD-10-CM | POA: Diagnosis not present

## 2020-09-01 DIAGNOSIS — I739 Peripheral vascular disease, unspecified: Secondary | ICD-10-CM | POA: Diagnosis not present

## 2020-09-01 DIAGNOSIS — I779 Disorder of arteries and arterioles, unspecified: Secondary | ICD-10-CM | POA: Diagnosis not present

## 2020-09-01 DIAGNOSIS — R0989 Other specified symptoms and signs involving the circulatory and respiratory systems: Secondary | ICD-10-CM | POA: Diagnosis not present

## 2020-09-01 DIAGNOSIS — E1151 Type 2 diabetes mellitus with diabetic peripheral angiopathy without gangrene: Secondary | ICD-10-CM | POA: Diagnosis not present

## 2020-09-01 DIAGNOSIS — E7800 Pure hypercholesterolemia, unspecified: Secondary | ICD-10-CM | POA: Diagnosis not present

## 2020-09-01 DIAGNOSIS — Z0000 Encounter for general adult medical examination without abnormal findings: Secondary | ICD-10-CM | POA: Diagnosis not present

## 2020-09-01 DIAGNOSIS — Z23 Encounter for immunization: Secondary | ICD-10-CM | POA: Diagnosis not present

## 2020-09-01 DIAGNOSIS — G459 Transient cerebral ischemic attack, unspecified: Secondary | ICD-10-CM | POA: Diagnosis not present

## 2020-09-01 DIAGNOSIS — Z7984 Long term (current) use of oral hypoglycemic drugs: Secondary | ICD-10-CM | POA: Diagnosis not present

## 2020-09-01 DIAGNOSIS — N400 Enlarged prostate without lower urinary tract symptoms: Secondary | ICD-10-CM | POA: Diagnosis not present

## 2020-09-02 ENCOUNTER — Other Ambulatory Visit: Payer: Self-pay | Admitting: Internal Medicine

## 2020-09-02 DIAGNOSIS — R0989 Other specified symptoms and signs involving the circulatory and respiratory systems: Secondary | ICD-10-CM

## 2020-09-07 ENCOUNTER — Other Ambulatory Visit: Payer: Self-pay | Admitting: Internal Medicine

## 2020-09-07 ENCOUNTER — Ambulatory Visit
Admission: RE | Admit: 2020-09-07 | Discharge: 2020-09-07 | Disposition: A | Discharge status: Home / Self Care | Payer: Medicare HMO | Source: Ambulatory Visit | Attending: Internal Medicine | Admitting: Internal Medicine

## 2020-09-07 ENCOUNTER — Other Ambulatory Visit: Payer: Self-pay

## 2020-09-07 ENCOUNTER — Encounter: Payer: Self-pay | Admitting: Internal Medicine

## 2020-09-07 DIAGNOSIS — R001 Bradycardia, unspecified: Secondary | ICD-10-CM | POA: Diagnosis not present

## 2020-09-07 DIAGNOSIS — J449 Chronic obstructive pulmonary disease, unspecified: Secondary | ICD-10-CM | POA: Diagnosis not present

## 2020-09-07 DIAGNOSIS — R0600 Dyspnea, unspecified: Secondary | ICD-10-CM | POA: Diagnosis not present

## 2020-09-07 DIAGNOSIS — R0602 Shortness of breath: Secondary | ICD-10-CM | POA: Diagnosis not present

## 2020-09-08 ENCOUNTER — Emergency Department (HOSPITAL_COMMUNITY)
Admission: EM | Admit: 2020-09-08 | Discharge: 2020-09-08 | Disposition: A | Discharge status: Home / Self Care | Payer: Medicare HMO | Attending: Emergency Medicine | Admitting: Emergency Medicine

## 2020-09-08 ENCOUNTER — Emergency Department (HOSPITAL_COMMUNITY): Payer: Medicare HMO

## 2020-09-08 ENCOUNTER — Other Ambulatory Visit: Payer: Self-pay | Admitting: Medical

## 2020-09-08 ENCOUNTER — Other Ambulatory Visit: Payer: Medicare HMO

## 2020-09-08 ENCOUNTER — Other Ambulatory Visit: Payer: Self-pay

## 2020-09-08 ENCOUNTER — Encounter (HOSPITAL_COMMUNITY): Payer: Self-pay | Admitting: Internal Medicine

## 2020-09-08 DIAGNOSIS — R531 Weakness: Secondary | ICD-10-CM | POA: Diagnosis not present

## 2020-09-08 DIAGNOSIS — R5383 Other fatigue: Secondary | ICD-10-CM | POA: Diagnosis not present

## 2020-09-08 DIAGNOSIS — R001 Bradycardia, unspecified: Secondary | ICD-10-CM | POA: Diagnosis not present

## 2020-09-08 DIAGNOSIS — F17210 Nicotine dependence, cigarettes, uncomplicated: Secondary | ICD-10-CM | POA: Diagnosis not present

## 2020-09-08 DIAGNOSIS — R079 Chest pain, unspecified: Secondary | ICD-10-CM

## 2020-09-08 DIAGNOSIS — J449 Chronic obstructive pulmonary disease, unspecified: Secondary | ICD-10-CM | POA: Diagnosis not present

## 2020-09-08 DIAGNOSIS — I493 Ventricular premature depolarization: Secondary | ICD-10-CM

## 2020-09-08 DIAGNOSIS — R42 Dizziness and giddiness: Secondary | ICD-10-CM

## 2020-09-08 HISTORY — DX: Gastro-esophageal reflux disease without esophagitis: K21.9

## 2020-09-08 HISTORY — DX: Chronic kidney disease, stage 3 unspecified: N18.30

## 2020-09-08 HISTORY — DX: Transient cerebral ischemic attack, unspecified: G45.9

## 2020-09-08 HISTORY — DX: Essential (primary) hypertension: I10

## 2020-09-08 HISTORY — DX: Peripheral vascular disease, unspecified: I73.9

## 2020-09-08 HISTORY — DX: Bradycardia, unspecified: R00.1

## 2020-09-08 HISTORY — DX: Type 2 diabetes mellitus without complications: E11.9

## 2020-09-08 HISTORY — DX: Hyperlipidemia, unspecified: E78.5

## 2020-09-08 LAB — COMPREHENSIVE METABOLIC PANEL
ALT: 15 U/L (ref 0–44)
AST: 20 U/L (ref 15–41)
Albumin: 3.9 g/dL (ref 3.5–5.0)
Alkaline Phosphatase: 44 U/L (ref 38–126)
Anion gap: 8 (ref 5–15)
BUN: 16 mg/dL (ref 8–23)
CO2: 26 mmol/L (ref 22–32)
Calcium: 9 mg/dL (ref 8.9–10.3)
Chloride: 96 mmol/L — ABNORMAL LOW (ref 98–111)
Creatinine, Ser: 1.18 mg/dL (ref 0.61–1.24)
GFR, Estimated: >60 mL/min (ref >60)
Glucose, Bld: 209 mg/dL — ABNORMAL HIGH (ref 70–99)
Potassium: 3.7 mmol/L (ref 3.5–5.1)
Sodium: 130 mmol/L — ABNORMAL LOW (ref 135–145)
Total Bilirubin: 1.2 mg/dL (ref 0.3–1.2)
Total Protein: 6.8 g/dL (ref 6.5–8.1)

## 2020-09-08 LAB — CBC WITH DIFFERENTIAL/PLATELET
Abs Immature Granulocytes: 0.03 10*3/uL (ref 0.00–0.07)
Basophils Absolute: 0.1 10*3/uL (ref 0.0–0.1)
Basophils Relative: 1 %
Eosinophils Absolute: 0.1 10*3/uL (ref 0.0–0.5)
Eosinophils Relative: 1 %
HCT: 49.3 % (ref 39.0–52.0)
Hemoglobin: 16.5 g/dL (ref 13.0–17.0)
Immature Granulocytes: 0 %
Lymphocytes Relative: 22 %
Lymphs Abs: 1.8 10*3/uL (ref 0.7–4.0)
MCH: 31.1 pg (ref 26.0–34.0)
MCHC: 33.5 g/dL (ref 30.0–36.0)
MCV: 93 fL (ref 80.0–100.0)
Monocytes Absolute: 0.7 10*3/uL (ref 0.1–1.0)
Monocytes Relative: 9 %
Neutro Abs: 5.6 10*3/uL (ref 1.7–7.7)
Neutrophils Relative %: 67 %
Platelets: 160 10*3/uL (ref 150–400)
RBC: 5.3 MIL/uL (ref 4.22–5.81)
RDW: 13.2 % (ref 11.5–15.5)
WBC: 8.3 10*3/uL (ref 4.0–10.5)
nRBC: 0 % (ref 0.0–0.2)

## 2020-09-08 LAB — TROPONIN I (HIGH SENSITIVITY)
Troponin I (High Sensitivity): 6 ng/L (ref <18)
Troponin I (High Sensitivity): 6 ng/L (ref <18)

## 2020-09-08 MED ORDER — SODIUM CHLORIDE 0.9 % IV BOLUS
500.0000 mL | Freq: Once | INTRAVENOUS | Status: AC
  Administered 2020-09-08: 500 mL via INTRAVENOUS

## 2020-09-08 NOTE — ED Triage Notes (Signed)
Emergency Medicine Provider Triage Evaluation Note  Jesse James , a 80 y.o. male  was evaluated in triage.  Pt complains of bradycardia with HR as low as 38 on a pulse oximeter.  He reports that he has been having episodes of lightheadedness mostly with standing.  He saw his PCP this past week for a general physical and was noted to have bradycardia in the 40s.  He had a ZIO monitor placed yesterday and was told to come to the ED his heart rate went up to the low 40s.  States that his heart rate was 38 prompting him to come.  He continues to have episodes of lightheadedness that resolved after sitting.  Denies any shortness of breath or chest pain.  No blurry vision double vision.  Does not follow with a cardiologist at this time, is supposed to be seen by 1 at some time however states he has heard back about his appointment.  Review of Systems  Positive: + lightheadedness, + bradycardia Negative: - chest pain, - SOB  Physical Exam  BP (!) 188/74 (BP Location: Left Arm)   Pulse (!) 48   Temp 98.3 F (36.8 C) (Oral)   Resp 20   SpO2 100%  Gen:   Awake, no distress   HEENT:  Atraumatic  Resp:  Normal effort  Cardiac:  Loletha Grayer Abd:   Nondistended, nontender  MSK:   Moves extremities without difficulty  Neuro:  Speech clear   Medical Decision Making  Medically screening exam initiated at 9:41 AM.  Appropriate orders placed.  Henreitta Cea was informed that the remainder of the evaluation will be completed by another provider, this initial triage assessment does not replace that evaluation, and the importance of remaining in the ED until their evaluation is complete.  Clinical Impression  80 year old male with hx of bradcardia; currently wearing a ZIO patch. Checked his heart rate with an oximeter at 38. HR in the ED at 48. No complaints currently while sitting. Stable at this time.    Eustaquio Maize, PA-C 09/08/20 [phone]

## 2020-09-08 NOTE — ED Notes (Signed)
Patient is resting comfortably. 

## 2020-09-08 NOTE — ED Triage Notes (Signed)
Pt here with c/o low HR , had a Heart monitor placed yesterday ny his pcp and has a cardiology appointment coming up , no cp but has been feeling light headed and weak, pt is a smoker

## 2020-09-08 NOTE — ED Provider Notes (Signed)
Seen by cardiology team.  After evaluation recommending continued outpatient care.  Patient relates plan.  Advising immediate return for worsening symptoms or any additional concerns.    Luna Fuse, MD 09/08/20 1700

## 2020-09-08 NOTE — ED Notes (Signed)
Pt given sandwich bag 

## 2020-09-08 NOTE — ED Provider Notes (Signed)
[facility] EMERGENCY DEPARTMENT Provider Note   CSN: [id number] Arrival date & time: 09/08/20  5035     History No chief complaint on file.   Jesse James is a 80 y.o. male.  HPI Patient presents with his wife who assists with the history. He presents due to new fatigue and weakness. Patient is generally well, active.  He notes that over the past 2 or 3 weeks he has had worsening fatigue, decreased exercise capacity.  Notably, the patient typically engages in high intensity interval training sessions daily.  He notes that during these sessions he does have elevation in his heart rate, but afterwards, where he is typically with a heart rate in the 50s, he has recently been in the 30s.  He has seen his physician, and had a Zio patch, monitoring device placed yesterday.  Today with heart rate in the 30s, fatigue, but no chest pain, no syncope, he called his physician.  He was referred here for evaluation. He has not had a cardiology follow-up, though that was anticipated after his visit yesterday with his primary care doctor.  No other recent medication change, device change, activity change or diet changes.   Past medical: None  No family history on file.  Social History   Tobacco Use  . Smoking status: Current Every Day Smoker    Packs/day: 1.00    Years: 60.00    Pack years: 60.00    Types: Cigarettes  . Tobacco comment: pt started Rx for smoking cessation (12/23/2012)  Substance Use Topics  . 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  . Drug use: No    Home Medications Prior to Admission medications   Medication Sig Start Date End Date Taking? Authorizing Provider  diazepam (VALIUM) 10 MG tablet Take 1 tablet (10 mg total) by mouth once. 05/28/13   Greggory Keen, MD    Allergies    Patient has no known allergies.  Review of Systems   Review of Systems  Constitutional:       Per HPI, otherwise negative  HENT:        Per HPI, otherwise negative  Respiratory:       Per HPI, otherwise negative  Cardiovascular:       Per HPI, otherwise negative  Gastrointestinal: Negative for vomiting.  Endocrine:       Negative aside from HPI  Genitourinary:       Neg aside from HPI   Musculoskeletal:       Per HPI, otherwise negative  Skin: Negative.   Neurological: Positive for weakness. Negative for syncope.    Physical Exam Updated Vital Signs BP (!) 129/54   Pulse (!) 50   Temp 98.3 F (36.8 C) (Oral)   Resp 12   Ht 5\' 10"  (1.778 m)   Wt 95 kg   SpO2 97%   BMI 30.05 kg/m   Physical Exam Vitals and nursing note reviewed.  Constitutional:      General: He is not in acute distress.    Appearance: He is well-developed.  HENT:     Head: Normocephalic and atraumatic.  Eyes:     Conjunctiva/sclera: Conjunctivae normal.  Cardiovascular:     Rate and Rhythm: Regular rhythm. Bradycardia present.  Pulmonary:     Effort: Pulmonary effort is normal. No respiratory distress.     Breath sounds: No stridor.  Abdominal:     General: There is no distension.  Skin:    General: Skin is warm and dry.  Neurological:     Mental Status: He is alert and oriented to person, place, and time.      ED Results / Procedures / Treatments   Labs (all labs ordered are listed, but only abnormal results are displayed) Labs Reviewed  COMPREHENSIVE METABOLIC PANEL - Abnormal; Notable for the following components:      Result Value   Sodium 130 (*)    Chloride 96 (*)    Glucose, Bld 209 (*)    All other components within normal limits  CBC WITH DIFFERENTIAL/PLATELET  TROPONIN I (HIGH SENSITIVITY)  TROPONIN I (HIGH SENSITIVITY)    EKG EKG Interpretation  Date/Time:  Wednesday September 08 2020 09:34:24 EDT Ventricular Rate:  58 PR Interval:  122 QRS Duration: 78 QT Interval:  402 QTC Calculation: 394 R Axis:   70 Text Interpretation: Sinus bradycardia with occasional Premature ventricular complexes and  Premature atrial complexes T wave abnormality Abnormal ECG Confirmed by Carmin Muskrat [id number]) on 09/08/2020 10:56:58 AM   Radiology DG Chest 2 View  Result Date: 09/08/2020 CLINICAL DATA:  Bradycardia. EXAM: CHEST - 2 VIEW COMPARISON:  Two-view chest x-ray 09/07/2020 FINDINGS: Heart size is normal. Atherosclerotic calcifications are present at the aortic arch. Cardiac monitor noted. Changes of COPD are evident. No edema or effusion is present. No focal airspace disease present changes are stable. Previously noted right middle lobe airspace disease has resolved. This was likely atelectasis. IMPRESSION: Changes of COPD without acute cardiopulmonary disease. Electronically Signed   By: San Morelle M.D.   On: 09/08/2020 10:16   DG Chest 2 View  Result Date: 09/08/2020 CLINICAL DATA:  Shortness of breath.  Dyspnea. EXAM: CHEST - 2 VIEW COMPARISON:  CT chest 10/23/2014 FINDINGS: Heart is enlarged. Asymmetric airspace opacity is noted in the right middle lobe. Lungs are otherwise clear. Changes of COPD noted. No edema or effusion is present. IMPRESSION: 1. Right middle lobe airspace disease is concerning for infection or atelectasis. 2. Cardiomegaly without failure. 3. Changes of COPD. Electronically Signed   By: San Morelle M.D.   On: 09/08/2020 10:17    Procedures Procedures   Medications Ordered in ED Medications - No data to display  ED Course  I have reviewed the triage vital signs and the nursing notes.  Pertinent labs & imaging results that were available during my care of the patient were reviewed by me and considered in my medical decision making (see chart for details).  On monitor the patient has sinus bradycardia, rate 40s, 50s with ectopy. Pulse oximetry 97% room air normal  Update:, Initial labs notable for mild hyponatremia, hyperglycemia.  Given the patient's symptomatic bradycardia I discussed his case with her cardiology colleagues for consultation.    3:24  PM Labs reviewed, patient remains in similar condition, heart rate now 40s, 50s.  2 troponins are normal, consistent with low suspicion for ACS.  However given the patient's symptomatic changes, episodic bradycardia, he is awaiting cardiology consult.  Other findings reassuring, no evidence for concurrent infection, electrolyte abnormalities beyond mild hyponatremia, hyperglycemia.  Patient has had fluid resuscitation.   Dr. Almyra Free is aware of the patient.  Next day chart completion.  Patient has been seen and evaluated by cardiology.  Patient discharged in stable condition to follow-up as an outpatient. MDM Rules/Calculators/A&P MDM Number of Diagnoses or Management Options Bradycardia: new, needed workup Weakness: new, needed workup   Amount and/or Complexity of Data Reviewed Clinical lab tests: reviewed  and ordered Tests in the radiology section of CPT: reviewed and ordered Tests in the medicine section of CPT: ordered and reviewed Decide to obtain previous medical records or to obtain history from someone other than the patient: yes Obtain history from someone other than the patient: yes Review and summarize past medical records: yes Discuss the patient with other providers: yes Independent visualization of images, tracings, or specimens: yes  Risk of Complications, Morbidity, and/or Mortality Presenting problems: high Diagnostic procedures: high Management options: high  Critical Care Total time providing critical care: < 30 minutes  Patient Progress Patient progress: stable  Final Clinical Impression(s) / ED Diagnoses Final diagnoses:  Weakness  Bradycardia     Carmin Muskrat, MD 09/09/20 1014

## 2020-09-08 NOTE — ED Notes (Signed)
Patient denies pain and is resting comfortably.  

## 2020-09-08 NOTE — ED Notes (Signed)
Family updated as to patient's status.

## 2020-09-08 NOTE — ED Notes (Signed)
Cardiology is at bedside at this time.

## 2020-09-08 NOTE — Consult Note (Addendum)
Cardiology Admission History and Physical:   Patient ID: Jesse James MRN: [id number]; DOB: 25-Jul-1940   Admission date: 09/08/2020  PCP:  Seward Carol, MD   Jesse James  Cardiologist:  No primary care provider on file.   Chief Complaint:  Bradycardia   Patient Profile:   Jesse James is a 80 y.o. male with past medical history of tobacco use disorder, TIA, carotid artery disease, GERD , T2DM , HTN , HLD , PAD, CKD stage III, and history of bradycardia who we were asked to evaluate for symptomatic bradycardia.  History of Present Illness:   Jesse James reports a 5-6 year history of having a slow heart rate and he feels okay when his heart rate is 50 bpm or higher.  He reports he has been asymptomatic until 3 weeks ago when he started to feel more fatigued and weak.  He has been completing a high intensity interval training workout daily. His heart rate will increase to the 130s during exercise does not experience any chest pain with his workouts.  He noted his heart rate was in the 30's and he presented to his primary care physician for further evaluation.  A Zio patch was placed yesterday and noted again his heart rate was in the 30s.  When his heart rate gets this low he feels fatigued and has some lightheadedness when going from sitting to standing.  He has not experienced syncope, chest pain, history of chest pain, or dyspnea with exertion.   Past Medical History:  Diagnosis Date  . Bradycardia   . Chronic kidney disease (CKD), stage III (moderate) (HCC)   . GERD (gastroesophageal reflux disease)   . Hyperlipidemia   . Hypertension   . Peripheral vascular disease ([place])   . TIA (transient ischemic attack)   . Type 2 diabetes mellitus ([place])     Past Surgical History:  Procedure Laterality Date  . APPENDECTOMY    . tonslectomy       Medications Prior to Admission: Prior to Admission medications   Medication Sig Start Date End Date Taking?  Authorizing Provider  diazepam (VALIUM) 10 MG tablet Take 1 tablet (10 mg total) by mouth once. 05/28/13   Greggory Keen, MD     Allergies:   No Known Allergies  Social History:   Social History   Socioeconomic History  . Marital status: Married    Spouse name: Not on file  . Number of children: Not on file  . Years of education: Not on file  . Highest education level: Not on file  Occupational History  . Not on file  Tobacco Use  . Smoking status: Current Every Day Smoker    Packs/day: 1.00    Years: 60.00    Pack years: 60.00    Types: Cigarettes  . Smokeless tobacco: Not on file  . Tobacco comment: pt started Rx for smoking cessation (12/23/2012)  Substance and Sexual Activity  . 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  . Drug use: No  . Sexual activity: Not on file  Other Topics Concern  . Not on file  Social History Narrative  . Not on file   Social Determinants of Health   Financial Resource Strain: Not on file  Food Insecurity: Not on file  Transportation Needs: Not on file  Physical Activity: Not on file  Stress: Not on file  Social Connections: Not on file  Intimate Partner Violence:  Not on file    Family History:   The patient's family history includes Diabetes in his sister; Diabetes type II in his father; Heart attack (age of onset: 33) in his father; Heart attack (age of onset: 84) in his brother; Heart disease in his mother.    ROS:  Please see the history of present illness.  All other ROS reviewed and negative.     Physical Exam/Data:   Vitals:   09/08/20 1345 09/08/20 1415 09/08/20 1445 09/08/20 1530  BP: (!) 130/59 134/60 (!) 141/64 (!) 127/50  Pulse: (!) 48 (!) 54 61 (!) 58  Resp: 15 18 14 17   Temp:      TempSrc:      SpO2: 98% 99% 98% 96%  Weight:      Height:        Intake/Output Summary (Last 24 hours) at 09/08/2020 1553 Last data filed at 09/08/2020 1507 Gross per 24 hour  Intake 500 ml  Output  --  Net 500 ml   Last 3 Weights 09/08/2020 05/28/2013 12/25/2012  Weight (lbs) 209 lb 7 oz 215 lb 210 lb  Weight (kg) 95 kg 97.523 kg 95.255 kg     Body mass index is 30.05 kg/m.  General: NAD, nl appearance HE: Normocephalic, atraumatic , EOMI, Conjunctivae normal ENT: No congestion, no rhinorrhea, no exudate or erythema , bilateral carotid bruits.  Cardiovascular: Normal rate, bradycardic.  No murmurs, rubs, or gallops Pulmonary : Effort normal, breath sounds normal. No wheezes, rales, or rhonchi Abdominal: soft, nontender,  bowel sounds present Musculoskeletal: no swelling , deformity, injury ,or tenderness in extremities Skin: Warm, dry Psychiatric/Behavioral:  normal mood, normal behavior   EKG:  The ECG that was done 09/08/2020 was personally reviewed and demonstrates the following:  Sinus bradycardia, 58/min PR  154ms QRS 3ms QT 473ms normal axis.  Nonspecific T wave changes, no signs acute ischemia. PVC  No prior EKG to compare - Personally Reviewed   Relevant CV Studies: none  Laboratory Data:  High Sensitivity Troponin:   Recent Labs  Lab 09/08/20 0948 09/08/20 1130  TROPONINIHS 6 6      Chemistry Recent Labs  Lab 09/08/20 0948  NA 130*  K 3.7  CL 96*  CO2 26  GLUCOSE 209*  BUN 16  CREATININE 1.18  CALCIUM 9.0  GFRNONAA >60  ANIONGAP 8    Recent Labs  Lab 09/08/20 0948  PROT 6.8  ALBUMIN 3.9  AST 20  ALT 15  ALKPHOS 44  BILITOT 1.2   Hematology Recent Labs  Lab 09/08/20 0948  WBC 8.3  RBC 5.30  HGB 16.5  HCT 49.3  MCV 93.0  MCH 31.1  MCHC 33.5  RDW 13.2  PLT 160   BNPNo results for input(s): BNP, PROBNP in the last 168 hours.  DDimer No results for input(s): DDIMER in the last 168 hours.   Radiology/Studies:  DG Chest 2 View  Result Date: 09/08/2020 CLINICAL DATA:  Bradycardia. EXAM: CHEST - 2 VIEW COMPARISON:  Two-view chest x-ray 09/07/2020 FINDINGS: Heart size is normal. Atherosclerotic calcifications are present at the  aortic arch. Cardiac monitor noted. Changes of COPD are evident. No edema or effusion is present. No focal airspace disease present changes are stable. Previously noted right middle lobe airspace disease has resolved. This was likely atelectasis. IMPRESSION: Changes of COPD without acute cardiopulmonary disease. Electronically Signed   By: San Morelle M.D.   On: 09/08/2020 10:16     Assessment and Plan:  80 year old with history  of bradycardia who is presenting with concern for heart rate in 30's to 40's on pulse ox with some lightheadedness when standing.   #Chronic bradycardia # Premature ventricular contractions  Reviewed outside records and TSH checked this week at PCP was 1.3.  Troponins are flat and EKG did not show acute ischemic changes. No recent infections.   Reviewed medications and patient is not currently on any beta-blockers or calcium channel blockers. - - Patient has been bradycardic on pulsometer at home. Telemetry shows frequent PVC's with a heart rate in 60's. Heavy PVC burden is likely causing his symptoms and pulsometer is falsely estimating heart rate due to PVC's. Patient symptoms are minimal and only experienced with going from sitting to standing.  Patient will need further outpatient evaluation but no acute change in his chronic bradycardia to warrant admission to hospital. Further workup as noted below.   - Continue Ziopatch - Outpatient follow up with EP cardiology , we will arrange this. - In addition will arrange for TTE  #Carotid Artery Disease - hx of carotid artery disease with TIA approximately 10 years ago. Patient is on Plavix.  - We will set up outpatient carotid ultrasound  Tamsen Snider, MD PGY2 Internal Medicine  For questions or updates, please contact [facility] Please consult [email or web address] for contact info under    Signed, Lorene Dy, MD  PGY2, IM 09/08/2020 3:53 PM   Patient seen, examined. Available data reviewed. Agree with  findings, assessment, and plan as outlined by Dr Court Joy. The patient is independently interviewed and examined.  His wife is at the bedside.  On my exam, he is alert, oriented, and in no distress.  HEENT is normal except for poor dentition, neck: JVP normal, carotid upstrokes normal with bilateral bruits, lungs are clear bilaterally, heart is regular rate and rhythm with frequent premature beats, abdomen is soft and nontender with no organomegaly, extremities have no edema.  Skin is warm and dry with no rash, neurologic is grossly intact with 5/5 strength bilaterally.  The patient's EKG shows normal sinus rhythm with PVCs, normal PR interval and normal QRS duration.  Labs are essentially normal.  The patient has experienced some postural dizziness over the last few weeks, increased from his baseline.  He has not really had presyncope or frank syncope.  He was advised to go to the emergency room today because his pulse oximeter gave him a heart rate reading in the 30s.  After reviewing his telemetry, I suspect his heart rate is under counted because of the presence of PVCs.  He is having frequent PVCs on telemetry as well as periods of ventricular bigeminy.  The patient is nontoxic-appearing and does not have any symptoms of major concern at this time.  I think he can be worked up as an outpatient.  I have recommended the following evaluation:   Continue outpatient telemetry monitoring, patient currently wearing a ZIO 7-day monitor  Check 2D echocardiogram to evaluate LV function in the context of symptomatic PVCs  Check carotid Doppler study to evaluate bilateral carotid bruits in this longstanding smoker with remote history of stroke  Arrange outpatient electrophysiology follow-up/evaluation once above studies are completed  Hold off on any specific medical therapy until patient's outpatient monitor is complete.  Considerations would include antiarrhythmic therapy or beta-blockade pending review of his  monitor.  The above outpatient testing will be arranged. The patient can be discharged from the Emergency Department today. He will be contacted by our office.   Sherren Mocha,  M.D. 09/08/2020 5:52 PM

## 2020-09-08 NOTE — Discharge Instructions (Addendum)
Call your primary care doctor or specialist as discussed in the next 2-3 days.   Return immediately back to the ER if:  Your symptoms worsen within the next 12-24 hours. You develop new symptoms such as new fevers, persistent vomiting, new pain, shortness of breath, or new weakness or numbness, or if you have any other concerns.  

## 2020-09-09 ENCOUNTER — Encounter: Payer: Self-pay | Admitting: *Deleted

## 2020-09-09 NOTE — Telephone Encounter (Signed)
error 

## 2020-09-13 ENCOUNTER — Other Ambulatory Visit: Payer: Self-pay

## 2020-09-13 ENCOUNTER — Ambulatory Visit (HOSPITAL_COMMUNITY)
Admission: RE | Admit: 2020-09-13 | Discharge: 2020-09-13 | Disposition: A | Discharge status: Home / Self Care | Payer: Medicare HMO | Source: Ambulatory Visit | Attending: Cardiology | Admitting: Cardiology

## 2020-09-13 ENCOUNTER — Other Ambulatory Visit (HOSPITAL_COMMUNITY): Payer: Self-pay | Admitting: Medical

## 2020-09-13 DIAGNOSIS — I6523 Occlusion and stenosis of bilateral carotid arteries: Secondary | ICD-10-CM

## 2020-09-13 DIAGNOSIS — R42 Dizziness and giddiness: Secondary | ICD-10-CM | POA: Diagnosis not present

## 2020-09-15 ENCOUNTER — Encounter (HOSPITAL_COMMUNITY): Payer: Medicare HMO

## 2020-09-20 ENCOUNTER — Telehealth: Payer: Self-pay | Admitting: *Deleted

## 2020-09-20 DIAGNOSIS — R001 Bradycardia, unspecified: Secondary | ICD-10-CM | POA: Diagnosis not present

## 2020-09-20 MED ORDER — ASPIRIN EC 81 MG PO TBEC: 81.0000 mg | DELAYED_RELEASE_TABLET | Freq: Every day | ORAL | 3 refills | Status: AC

## 2020-09-20 NOTE — Telephone Encounter (Signed)
Spoke with pt wife, aware of results and recommendations. He is taking plavix and atorvastatin by her report. Medications added to his list. Aware of follow up appointments.

## 2020-09-20 NOTE — Telephone Encounter (Signed)
-----   Message from Abigail Butts, PA-C sent at 09/16/2020 10:32 PM EDT ----- Please notify the patient that the ultrasounds of the arteries in his neck showed mild-moderate plaque buildup. This does not require any further testing at this time, though will requiring continued annual monitoring. Please verify patient's home medications - consult note indicates he is on plavix, though I do not see this listed. If he is only on diazepam as listed, would recommend starting aspirin 81mg  daily and if not already on a statin, would start atorvastatin 40mg  daily in an effort to minimize progression of disease. He can discuss these results in detail at his visit with Dr. Curt Bears 10/05/20. Thank you!

## 2020-09-28 DIAGNOSIS — R001 Bradycardia, unspecified: Secondary | ICD-10-CM | POA: Diagnosis not present

## 2020-10-04 ENCOUNTER — Ambulatory Visit (HOSPITAL_COMMUNITY): Payer: Medicare HMO | Attending: Cardiovascular Disease

## 2020-10-04 ENCOUNTER — Other Ambulatory Visit: Payer: Self-pay

## 2020-10-04 DIAGNOSIS — I493 Ventricular premature depolarization: Secondary | ICD-10-CM | POA: Diagnosis not present

## 2020-10-04 DIAGNOSIS — R079 Chest pain, unspecified: Secondary | ICD-10-CM | POA: Diagnosis not present

## 2020-10-04 LAB — ECHOCARDIOGRAM COMPLETE
Area-P 1/2: 1.89 cm2
S' Lateral: 3 cm

## 2020-10-04 NOTE — Progress Notes (Signed)
Patient ID: Jesse James, male   DOB: Oct 20, 1940, 80 y.o.   MRN: [id number]  Echocardiogram 2D Echocardiogram has been performed.  Jennette Dubin 10/04/20

## 2020-10-05 ENCOUNTER — Ambulatory Visit: Payer: Medicare HMO | Admitting: Internal Medicine

## 2020-10-05 ENCOUNTER — Institutional Professional Consult (permissible substitution): Payer: Medicare HMO | Admitting: Cardiology

## 2020-10-05 ENCOUNTER — Encounter: Payer: Self-pay | Admitting: Internal Medicine

## 2020-10-05 DIAGNOSIS — F17210 Nicotine dependence, cigarettes, uncomplicated: Secondary | ICD-10-CM | POA: Diagnosis not present

## 2020-10-05 DIAGNOSIS — J449 Chronic obstructive pulmonary disease, unspecified: Secondary | ICD-10-CM | POA: Diagnosis not present

## 2020-10-05 NOTE — Progress Notes (Signed)
Jesse James, male    DOB: [birth year]    MRN: [id number]   Brief patient profile:  70 yowm  active smoker referred to pulmonary clinic 10/05/2020 by Jesse James with dizziness on exertion referred to pulmonary clinic 10/05/2020 by Jesse   Delfina James      History of Present Illness  10/05/2020  Pulmonary/ 1st office eval/ Jesse James / [facility]  Chief Complaint  Patient presents with  . Pulmonary Consult    Referred by Jesse James. Pt states that he has been having some dizziness after exertion recently. He states his pulse rate has been low also.   Dyspnea:  Stationery bike x 12 min x3 x per week tolerates fine/ worse when bend over a t waist   Cough: none  Sleep: bed is flat 1 pillow/ says sleeps fine SABA use: not using  covid  Status:  vax x 4   No obvious day to day or daytime variability or assoc excess/ purulent sputum or mucus plugs or hemoptysis or cp or chest tightness, subjective wheeze or overt sinus or hb symptoms.   Sleeping ok  without nocturnal  or early am exacerbation  of respiratory  c/o's or need for noct saba. Also denies any obvious fluctuation of symptoms with weather or environmental changes or other aggravating or alleviating factors except as outlined above   No unusual exposure hx or h/o childhood pna/ asthma or knowledge of premature birth.  Current Allergies, Complete Past Medical History, Past Surgical History, Family History, and Social History were reviewed in [employer] record.  ROS  The following are not active complaints unless bolded Hoarseness, sore throat, dysphagia, dental problems, itching, sneezing,  nasal congestion or discharge of excess mucus or purulent secretions, ear ache,   fever, chills, sweats, unintended wt loss or wt gain, classically pleuritic or exertional cp,  orthopnea pnd or arm/hand swelling  or leg swelling, presyncope, palpitations, abdominal pain, anorexia, nausea, vomiting, diarrhea  or change in  bowel habits or change in bladder habits, change in stools or change in urine, dysuria, hematuria,  rash, arthralgias, visual complaints, headache, numbness, weakness or ataxia or problems with walking or coordination,  change in mood or  memory.            Past Medical History:  Diagnosis Date  . Bradycardia   . Chronic kidney disease (CKD), stage III (moderate) (HCC)   . GERD (gastroesophageal reflux disease)   . Hyperlipidemia   . Hypertension   . Peripheral vascular disease ([place])   . TIA (transient ischemic attack)   . Type 2 diabetes mellitus ([place])     Outpatient Medications Prior to Visit  Medication Sig Dispense Refill  . ALBUTEROL IN Inhale 2 puffs into the lungs every 4 (four) hours as needed.    Marland Kitchen aspirin EC 81 MG tablet Take 1 tablet (81 mg total) by mouth daily. Swallow whole. 90 tablet 3  . atorvastatin (LIPITOR) 80 MG tablet Take 1 tablet (80 mg total) by mouth daily. 90 tablet 3  . clopidogrel (PLAVIX) 75 MG tablet Take 1 tablet (75 mg total) by mouth daily. 90 tablet 3  . diazepam (VALIUM) 10 MG tablet Take 1 tablet (10 mg total) by mouth once. 2 tablet 0   No facility-administered medications prior to visit.     Objective:     BP 132/78 (BP Location: Left Arm, Cuff Size: Normal)   Pulse (!) 51   Temp (!) 97.4 F (36.3 C) (Temporal)  Ht 5\' 8"  (1.727 m)   Wt 201 lb 9.6 oz (91.4 kg)   SpO2 97% Comment: on RA  BMI 30.65 kg/m   SpO2: 97 % (on RA)   HEENT : pt wearing mask not removed for exam due to covid - 19 concerns.   NECK :  without JVD/Nodes/TM/ nl carotid upstrokes bilaterally   LUNGS: no acc muscle use,  Min barrel/ mod kyphotic contour chest wall with bilateral  slightly decreased bs s audible wheeze and  without cough on insp or exp maneuvers and min  Hyperresonant  to  percussion bilaterally     CV:  RRR  no s3 or murmur or increase in P2, and  Trace pitting R > L LE  ABD:  soft and nontender with pos end  insp Hoover's  in the supine  position. No bruits or organomegaly appreciated, bowel sounds nl  MS:   Nl gait/  ext warm without deformities, calf tenderness, cyanosis or clubbing No obvious joint restrictions   SKIN: warm and dry without lesions    NEURO:  alert, approp, nl sensorium with  no motor or cerebellar deficits apparent.        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  Active smoker  -  10/05/2020   Walked RA  3 laps @ approx 27ft each @ moderately fast pace  stopped due to end of study, min sob and talkative the whole time, sats 96% but pulse only 65     I reviewed the Fletcher curve with the patient that basically indicates  if you quit smoking when your best day FEV1 is still well preserved (as is probably still   the case here)  it is highly unlikely you will progress to severe disease and informed the patient there was  no medication on the market that has proven to alter the curve/ its downward trajectory  or the likelihood of progression of their disease(unlike other chronic medical conditions such as atheroclerosis where we do think we can change the natural hx with risk reducing meds)    Therefore stopping smoking and maintaining abstinence are  the most important aspects of care, not choice of inhalers or for that matter, doctors.   Treatment other than smoking cessation  is entirely directed by severity of symptoms and focused also on reducing exacerbations, not attempting to change the natural history of the disease.  Since not having exacerbations or limited by sob, only needs saba for future use prn flare  >>> rec baseline pfts at his convenience, no regular f/u here.    Cigarette smoker Counseled re importance of smoking cessation but did not meet time criteria for separate billing     Each maintenance medication was reviewed  in detail including emphasizing most importantly the difference between maintenance and prns and under what circumstances the prns are to be triggered using an action plan format where appropriate.  Total time for H and P, chart review, counseling,  directly observing portions of ambulatory 02 saturation study/ and generating customized AVS unique to this office visit / same day charting = 35 min                 Christinia Gully, MD 10/05/2020

## 2020-10-05 NOTE — Assessment & Plan Note (Signed)
Counseled re importance of smoking cessation but did not meet time criteria for separate billing     Each maintenance medication was reviewed in detail including emphasizing most importantly the difference between maintenance and prns and under what circumstances the prns are to be triggered using an action plan format where appropriate.  Total time for H and P, chart review, counseling,  directly observing portions of ambulatory 02 saturation study/ and generating customized AVS unique to this office visit / same day charting = 35 min

## 2020-10-05 NOTE — Patient Instructions (Signed)
We will walk you today to get a baseline   We will call schedule you for a PFTs and I will call the results to you

## 2020-10-05 NOTE — Assessment & Plan Note (Signed)
Active smoker  -  10/05/2020   Walked RA  3 laps @ approx 253ft each @ moderately fast pace  stopped due to end of study, min sob and talkative the whole time, sats 96% but pulse only 65    n I reviewed the Fletcher curve with the patient that basically indicates  if you quit smoking when your best day FEV1 is still well preserved (as is probably still   the case here)  it is highly unlikely you will progress to severe disease and informed the patient there was  no medication on the market that has proven to alter the curve/ its downward trajectory  or the likelihood of progression of their disease(unlike other chronic medical conditions such as atheroclerosis where we do think we can change the natural hx with risk reducing meds)    Therefore stopping smoking and maintaining abstinence are  the most important aspects of care, not choice of inhalers or for that matter, doctors.   Treatment other than smoking cessation  is entirely directed by severity of symptoms and focused also on reducing exacerbations, not attempting to change the natural history of the disease.  Since not having exacerbations or limited by sob, only needs saba for future use prn flare  >>> rec baseline pfts at his convenience, no regular f/u here.

## 2020-10-12 ENCOUNTER — Encounter: Payer: Self-pay | Admitting: *Deleted

## 2020-10-12 ENCOUNTER — Ambulatory Visit (INDEPENDENT_AMBULATORY_CARE_PROVIDER_SITE_OTHER): Payer: Medicare HMO | Admitting: Cardiology

## 2020-10-12 ENCOUNTER — Encounter: Payer: Self-pay | Admitting: Cardiology

## 2020-10-12 ENCOUNTER — Other Ambulatory Visit: Payer: Self-pay

## 2020-10-12 ENCOUNTER — Other Ambulatory Visit (HOSPITAL_COMMUNITY)
Admission: RE | Admit: 2020-10-12 | Discharge: 2020-10-12 | Disposition: A | Discharge status: Home / Self Care | Payer: Medicare HMO | Source: Ambulatory Visit | Attending: Internal Medicine | Admitting: Internal Medicine

## 2020-10-12 VITALS — BP 140/68 | HR 58 | Ht 68.0 in | Wt 200.6 lb

## 2020-10-12 DIAGNOSIS — I493 Ventricular premature depolarization: Secondary | ICD-10-CM | POA: Diagnosis not present

## 2020-10-12 DIAGNOSIS — Z01812 Encounter for preprocedural laboratory examination: Secondary | ICD-10-CM | POA: Diagnosis not present

## 2020-10-12 DIAGNOSIS — I2510 Atherosclerotic heart disease of native coronary artery without angina pectoris: Secondary | ICD-10-CM

## 2020-10-12 DIAGNOSIS — Z20822 Contact with and (suspected) exposure to covid-19: Secondary | ICD-10-CM | POA: Insufficient documentation

## 2020-10-12 LAB — SARS CORONAVIRUS 2 (TAT 6-24 HRS): SARS Coronavirus 2: NEGATIVE

## 2020-10-12 MED ORDER — FLECAINIDE ACETATE 50 MG PO TABS: 50.0000 mg | ORAL_TABLET | Freq: Two times a day (BID) | ORAL | 3 refills | Status: DC

## 2020-10-12 NOTE — Patient Instructions (Addendum)
Medication Instructions:  Your physician has recommended you make the following change in your medication:  1. START Flecainide 50 mg twice a day  *If you need a refill on your cardiac medications before your next appointment, please call your pharmacy*   Lab Work: Your physician recommends that you return for lab work for: fasting lipid panel.  You will need to be fasting for this blood work.  If you have labs (blood work) drawn today and your tests are completely normal, you will receive your results only by: Marland Kitchen MyChart Message (if you have MyChart) OR . A paper copy in the mail If you have any lab test that is abnormal or we need to change your treatment, we will call you to review the results.   Testing/Procedures: Your physician has requested that you have en exercise stress myoview. For further information please visit [email or web address]. Please follow instruction sheet, as given.   Follow-Up: At [facility], you and your health needs are our priority.  As part of our continuing mission to provide you with exceptional heart care, we have created designated Provider Care Teams.  These Care Teams include your primary Cardiologist (physician) and Advanced Practice Providers (APPs -  Physician Assistants and Nurse Practitioners) who all work together to provide you with the care you need, when you need it.  Your next appointment:   3 month(s)  The format for your next appointment:   In Person  Provider:   Allegra Lai, MD    Thank you for choosing [facility]!!   Trinidad Curet, RN [phone]   Other Instructions  Your physician has requested that you have a lexiscan myoview. For further information please visit [email or web address].   How to Prepare for Your Myoview Test:        A. Nothing to eat or drink 2 hours prior to arrival time, except you may drink water       B. No Caffeine/Decaffeinated products or chocolate 12 hours prior to arrival.       C. No  Cologne or Lotion       D. Wear comfortable walking shoes       E. Total time is 3 to 4 hours; you may want to bring reading material for the waiting               time. If someone comes with you, they will need to remain in the lobby due to                  limited space in the testing area.        F. Please report to [address], [address] for your test.        G. Medication Instructions:   After You Arrive:  Once you arrive in the Nuclear Cardiology lab, an IV will be started, then the Technologist will inject a small amount of radioactive tracer. There will be a 1 hour waiting period after this injection. A series of pictures will be taken of your heart following this waiting period. You will be prepped for the stress portion of the test. During the stress portion of your test a small amount of radioactive tracer will be injected in your IV. After the stress portion, there is a short rest period during which time your heart and blood pressure will be monitored. After the short test period the Technologist will begin your second set of pictures. Your doctor will inform you of your test  results within 7 days.  In preparation for your appointment, medication, and supplies will be purchased. Appointment availability is limited, so if you need to cancel or reschedule please call the Nuclear Department at [phone], 24 hours in advance to avoid a cancellation fee of $50.00     Flecainide tablets What is this medicine? FLECAINIDE (FLEK a nide) is an antiarrhythmic drug. This medicine is used to prevent irregular heart rhythm. It can also slow down fast heartbeats called tachycardia. This medicine may be used for other purposes; ask your health care provider or pharmacist if you have questions. COMMON BRAND NAME(S): Tambocor What should I tell my health care provider before I take this medicine? They need to know if you have any of these conditions:  abnormal levels of potassium in  the blood  heart disease including heart rhythm and heart rate problems  kidney or liver disease  recent heart attack  an unusual or allergic reaction to flecainide, local anesthetics, other medicines, foods, dyes, or preservatives  pregnant or trying to get pregnant  breast-feeding How should I use this medicine? Take this medicine by mouth with a glass of water. Follow the directions on the prescription label. You can take this medicine with or without food. Take your doses at regular intervals. Do not take your medicine more often than directed. Do not stop taking this medicine suddenly. This may cause serious, heart-related side effects. If your doctor wants you to stop the medicine, the dose may be slowly lowered over time to avoid any side effects. Talk to your pediatrician regarding the use of this medicine in children. While this drug may be prescribed for children as young as 1 year of age for selected conditions, precautions do apply. Overdosage: If you think you have taken too much of this medicine contact a poison control center or emergency room at once. NOTE: This medicine is only for you. Do not share this medicine with others. What if I miss a dose? If you miss a dose, take it as soon as you can. If it is almost time for your next dose, take only that dose. Do not take double or extra doses. What may interact with this medicine? Do not take this medicine with any of the following medications:  amoxapine  arsenic trioxide  certain antibiotics like clarithromycin, erythromycin, gatifloxacin, gemifloxacin, levofloxacin, moxifloxacin, sparfloxacin, or troleandomycin  certain antidepressants called tricyclic antidepressants like amitriptyline, imipramine, or nortriptyline  certain medicines to control heart rhythm like disopyramide, encainide, moricizine, procainamide, propafenone, and  quinidine  cisapride  delavirdine  droperidol  haloperidol  haw[place]orn  imatinib  levomethadyl  maprotiline  medicines for malaria like chloroquine and halofantrine  pentamidine  phenothiazines like chlorpromazine, mesoridazine, prochlorperazine, thioridazine  pimozide  quinine  ranolazine  ritonavir  sertindole This medicine may also interact with the following medications:  cimetidine  dofetilide  medicines for angina or high blood pressure  medicines to control heart rhythm like amiodarone and digoxin  ziprasidone This list may not describe all possible interactions. Give your health care provider a list of all the medicines, herbs, non-prescription drugs, or dietary supplements you use. Also tell them if you smoke, drink alcohol, or use illegal drugs. Some items may interact with your medicine. What should I watch for while using this medicine? Visit your doctor or health care professional for regular checks on your progress. Because your condition and the use of this medicine carries some risk, it is a good idea to carry an identification card,  necklace or bracelet with details of your condition, medications and doctor or health care professional. Check your blood pressure and pulse rate regularly. Ask your health care professional what your blood pressure and pulse rate should be, and when you should contact him or her. Your doctor or health care professional also may schedule regular blood tests and electrocardiograms to check your progress. You may get drowsy or dizzy. Do not drive, use machinery, or do anything that needs mental alertness until you know how this medicine affects you. Do not stand or sit up quickly, especially if you are an older patient. This reduces the risk of dizzy or fainting spells. Alcohol can make you more dizzy, increase flushing and rapid heartbeats. Avoid alcoholic drinks. What side effects may I notice from receiving this  medicine? Side effects that you should report to your doctor or health care professional as soon as possible:  chest pain, continued irregular heartbeats  difficulty breathing  swelling of the legs or feet  trembling, shaking  unusually weak or tired Side effects that usually do not require medical attention (report to your doctor or health care professional if they continue or are bothersome):  blurred vision  constipation  headache  nausea, vomiting  stomach pain This list may not describe all possible side effects. Call your doctor for medical advice about side effects. You may report side effects to FDA at 1-800-FDA-1088. Where should I keep my medicine? Keep out of the reach of children. Store at room temperature between 15 and 30 degrees C (59 and 86 degrees F). Protect from light. Keep container tightly closed. Throw away any unused medicine after the expiration date. NOTE: This sheet is a summary. It may not cover all possible information. If you have questions about this medicine, talk to your doctor, pharmacist, or health care provider.  2021 [employer]vier/Gold Standard (2018-04-22 11:41:38)

## 2020-10-12 NOTE — Progress Notes (Signed)
Electrophysiology Office Note   Date:  10/12/2020   ID:  Jesse James, DOB August 04, 1940, MRN [id number]  PCP:  Seward Carol, MD  Cardiologist:   Primary Electrophysiologist:  Satoshi Kalas Meredith Leeds, MD    Chief Complaint: PVC   History of Present Illness: Jesse James is a 80 y.o. male who is being seen today for the evaluation of PVC at the request of Seward Carol, MD. Presenting today for electrophysiology evaluation.  He has a history of tobacco abuse, TIA, carotid artery disease, GERD, type 2 diabetes, hypertension, hyperlipidemia, PAD, CKD stage III, and bradycardia.  He presented to the hospital 09/08/2020 with slow heart rates.  He had been asymptomatic until 3 weeks prior where he started to feel more fatigued and weak.  He had been completing high intensity interval training on a daily basis.  He was able to get his heart rate into the 130s with exercise.  He does not have chest discomfort with his workouts.  He noted his heart rate in the 30s and a Zio patch was placed.  He was found to have a 20% PVC burden.  Today, he denies symptoms of palpitations, chest pain, orthopnea, PND, lower extremity edema, claudication, dizziness, presyncope, syncope, bleeding, or neurologic sequela. The patient is tolerating medications without difficulties.  He has noted fatigue.  He states that when he exercises, he is no longer in his heart rate up into the 130s.  He has never had chest pain when he exercises, just shortness of breath and fatigue.  He would prefer medications at this time to treat his PVCs.   Past Medical History:  Diagnosis Date  . Bradycardia   . Chronic kidney disease (CKD), stage III (moderate) (HCC)   . GERD (gastroesophageal reflux disease)   . Hyperlipidemia   . Hypertension   . Peripheral vascular disease ([place])   . TIA (transient ischemic attack)   . Type 2 diabetes mellitus ([place])    Past Surgical History:  Procedure Laterality Date  . APPENDECTOMY    .  tonslectomy       Current Outpatient Medications  Medication Sig Dispense Refill  . ALBUTEROL IN Inhale 2 puffs into the lungs every 4 (four) hours as needed.    Marland Kitchen aspirin EC 81 MG tablet Take 1 tablet (81 mg total) by mouth daily. Swallow whole. 90 tablet 3  . chlorthalidone (HYGROTON) 25 MG tablet Take 1 tablet by mouth every morning.    . clopidogrel (PLAVIX) 75 MG tablet Take 1 tablet (75 mg total) by mouth daily. 90 tablet 3  . flecainide (TAMBOCOR) 50 MG tablet Take 1 tablet (50 mg total) by mouth 2 (two) times daily. 60 tablet 3  . metFORMIN (GLUCOPHAGE-XR) 500 MG 24 hr tablet TAKE 2 TABLETS ONE TIME DAILY WITH FOOD    . simvastatin (ZOCOR) 80 MG tablet 1 tablet     No current facility-administered medications for this visit.    Allergies:   Patient has no known allergies.   Social History:  The patient  reports that he has been smoking cigarettes. He has a 60.00 pack-year smoking history. He has never used smokeless tobacco. He reports that he does not drink alcohol and does not use drugs.   Family History:  The patient's family history includes Diabetes in his sister; Diabetes type II in his father; Heart attack (age of onset: 81) in his father; Heart attack (age of onset: 22) in his brother; Heart disease in his mother.  ROS:  Please see the history of present illness.   Otherwise, review of systems is positive for none.   All other systems are reviewed and negative.    PHYSICAL EXAM: VS:  BP 140/68   Pulse (!) 58   Ht 5\' 8"  (1.727 m)   Wt 200 lb 9.6 oz (91 kg)   SpO2 96%   BMI 30.50 kg/m  , BMI Body mass index is 30.5 kg/m. GEN: Well nourished, well developed, in no acute distress  HEENT: normal  Neck: no JVD, carotid bruits, or masses Cardiac: Irregular; no murmurs, rubs, or gallops,no edema  Respiratory:  clear to auscultation bilaterally, normal work of breathing GI: soft, nontender, nondistended, + BS MS: no deformity or atrophy  Skin: warm and dry Neuro:   Strength and sensation are intact Psych: euthymic mood, full affect  EKG:  EKG is ordered today. Personal review of the ekg ordered shows sinus rhythm, PVCs   Recent Labs: 09/08/2020: ALT 15; BUN 16; Creatinine, Ser 1.18; Hemoglobin 16.5; Platelets 160; Potassium 3.7; Sodium 130    Lipid Panel  No results found for: CHOL, TRIG, HDL, CHOLHDL, VLDL, LDLCALC, LDLDIRECT   Wt Readings from Last 3 Encounters:  10/12/20 200 lb 9.6 oz (91 kg)  10/05/20 201 lb 9.6 oz (91.4 kg)  09/08/20 209 lb 7 oz (95 kg)      Other studies Reviewed: Additional studies/ records that were reviewed today include: TTE 10/04/20  Review of the above records today demonstrates:  1. Left ventricular ejection fraction, by estimation, is 60 to 65%. The  left ventricle has normal function. The left ventricle has no regional  wall motion abnormalities. Left ventricular diastolic parameters were  normal.  2. Right ventricular systolic function is normal. The right ventricular  size is normal. There is normal pulmonary artery systolic pressure.  3. Left atrial size was mildly dilated.  4. The mitral valve is normal in structure. Trivial mitral valve  regurgitation. No evidence of mitral stenosis.  5. The aortic valve is tricuspid. There is mild calcification of the  aortic valve. Aortic valve regurgitation is not visualized. Mild aortic  valve sclerosis is present, with no evidence of aortic valve stenosis.  6. The inferior vena cava is normal in size with greater than 50%  respiratory variability, suggesting right atrial pressure of 3 mmHg.   Cardiac monitor personally reviewed Max 135 bpm  Min 41 bpm Average 57 bpm 5.7% supraventricular ectopy 20.3% ventricular ectopy Predominant underlying rhythm was sinus rhythm symptoms associated with ventricular bigeminy  TTE 02/18/2020 Mild concentric LVH Ejection fraction 55 to 60% Mildly dilated left atrium Mild aortic valve sclerosis .  Good ASSESSMENT  AND PLAN:  1.  PVCs: Troponins were negative in the emergency room.  ECG without ischemic changes.  He wore a cardiac monitor that showed a 20% PVC burden.  He feels weak and fatigued with his PVCs.  At this point, he would like medication management.  He would prefer to avoid amiodarone as an initial drug.  We Azaliah Carrero plan for flecainide.  We Yutaka Holberg order a stress test with SPECT imaging to rule out coronary artery disease as he does have carotid artery disease.  2.  Hypertension: Currently well controlled  3.  Hyperlipidemia: Continue simvastatin.  We Jshaun Abernathy check fasting lipids as he does have carotid artery disease.    Current medicines are reviewed at length with the patient today.   The patient does not have concerns regarding his medicines.  The following changes  were made today:  none  Labs/ tests ordered today include: Not Orders Placed This Encounter  Procedures  . Lipid Profile  . Cardiac Stress Test: Informed Consent Details: Physician/Practitioner Attestation; Transcribe to consent form and obtain patient signature  . MYOCARDIAL PERFUSION IMAGING  . EKG 12-Lead     Disposition:   FU with Landry Lookingbill 3 months  Signed, Cassady Turano Meredith Leeds, MD  10/12/2020 10:38 AM     [facility] [address] [place] [address] [phone] (office) [phone] (fax)

## 2020-10-13 ENCOUNTER — Telehealth: Payer: Self-pay | Admitting: Cardiology

## 2020-10-13 ENCOUNTER — Ambulatory Visit (INDEPENDENT_AMBULATORY_CARE_PROVIDER_SITE_OTHER): Payer: Medicare HMO | Admitting: Internal Medicine

## 2020-10-13 DIAGNOSIS — J449 Chronic obstructive pulmonary disease, unspecified: Secondary | ICD-10-CM | POA: Diagnosis not present

## 2020-10-13 LAB — PULMONARY FUNCTION TEST
DL/VA % pred: 113 %
DL/VA: 4.4 ml/min/mmHg/L
DLCO cor % pred: 94 %
DLCO cor: 22.88 ml/min/mmHg
DLCO unc % pred: 94 %
DLCO unc: 22.88 ml/min/mmHg
FEF 25-75 Post: 1.91 L/sec
FEF 25-75 Pre: 1.18 L/sec
FEF2575-%Change-Post: 61 %
FEF2575-%Pred-Post: 99 %
FEF2575-%Pred-Pre: 61 %
FEV1-%Change-Post: 15 %
FEV1-%Pred-Post: 73 %
FEV1-%Pred-Pre: 64 %
FEV1-Post: 2.09 L
FEV1-Pre: 1.81 L
FEV1FVC-%Change-Post: -1 %
FEV1FVC-%Pred-Pre: 99 %
FEV6-%Change-Post: 17 %
FEV6-%Pred-Post: 80 %
FEV6-%Pred-Pre: 68 %
FEV6-Post: 2.98 L
FEV6-Pre: 2.54 L
FEV6FVC-%Change-Post: 0 %
FEV6FVC-%Pred-Post: 107 %
FEV6FVC-%Pred-Pre: 107 %
FVC-%Change-Post: 17 %
FVC-%Pred-Post: 74 %
FVC-%Pred-Pre: 63 %
FVC-Post: 2.98 L
FVC-Pre: 2.54 L
Post FEV1/FVC ratio: 70 %
Post FEV6/FVC ratio: 100 %
Pre FEV1/FVC ratio: 71 %
Pre FEV6/FVC Ratio: 100 %
RV % pred: 144 %
RV: 3.87 L
TLC % pred: 93 %
TLC: 6.57 L

## 2020-10-13 NOTE — Telephone Encounter (Signed)
Returned pt call. Informed that it may take a couple days to "kick in/start working".   Advised that if no improvement by next week to call back to further discuss w/ MD if medication increase is needed. Pt agreeable to plan and thanks me for return call.

## 2020-10-13 NOTE — Progress Notes (Signed)
Full PFT performed today. °

## 2020-10-13 NOTE — Telephone Encounter (Signed)
Pt c/o medication issue:  1. Name of Medication: flecainide (TAMBOCOR) 50 MG tablet  2. How are you currently taking this medication (dosage and times per day)? As directed.   3. Are you having a reaction (difficulty breathing--STAT)?   4. What is your medication issue? Patient wanted to know when to expect the medicine to start working

## 2020-10-13 NOTE — Patient Instructions (Signed)
Full PFT performed today. °

## 2020-10-14 ENCOUNTER — Encounter: Payer: Self-pay | Admitting: *Deleted

## 2020-10-15 NOTE — Telephone Encounter (Signed)
Received a message from patient asking for the results of his PFT done on 10/13/20.   Dr. Melvyn Novas, can you please advise? Thanks!

## 2020-10-15 NOTE — Telephone Encounter (Signed)
Mild copd with asthma component   - for now just use albuterol prn but Be sure patient has/keeps f/u ov so we can go over all the details of this study and get a plan together moving forward - ok to move up f/u if not feeling better and wants to be seen sooner

## 2020-10-19 ENCOUNTER — Telehealth (HOSPITAL_COMMUNITY): Payer: Self-pay | Admitting: *Deleted

## 2020-10-19 NOTE — Telephone Encounter (Signed)
Patient's wife given detailed instructions per Myocardial Perfusion Study Information Sheet for the test on 10/22/2020 at 7:30. Patient notified to arrive 15 minutes early and that it is imperative to arrive on time for appointment to keep from having the test rescheduled.  If you need to cancel or reschedule your appointment, please call the office within 24 hours of your appointment. . Patient verbalized understanding.Jesse James

## 2020-10-23 ENCOUNTER — Other Ambulatory Visit: Payer: Self-pay

## 2020-10-25 ENCOUNTER — Other Ambulatory Visit: Payer: Self-pay

## 2020-10-25 ENCOUNTER — Other Ambulatory Visit: Payer: Medicare HMO

## 2020-10-25 ENCOUNTER — Ambulatory Visit (HOSPITAL_COMMUNITY): Payer: Medicare HMO | Attending: Cardiology

## 2020-10-25 DIAGNOSIS — I2510 Atherosclerotic heart disease of native coronary artery without angina pectoris: Secondary | ICD-10-CM | POA: Diagnosis not present

## 2020-10-25 DIAGNOSIS — I493 Ventricular premature depolarization: Secondary | ICD-10-CM | POA: Diagnosis not present

## 2020-10-25 LAB — LIPID PANEL
Chol/HDL Ratio: 2.3 ratio (ref 0.0–5.0)
Cholesterol, Total: 108 mg/dL (ref 100–199)
HDL: 46 mg/dL (ref >39)
LDL Chol Calc (NIH): 48 mg/dL (ref 0–99)
Triglycerides: 62 mg/dL (ref 0–149)
VLDL Cholesterol Cal: 14 mg/dL (ref 5–40)

## 2020-10-25 LAB — MYOCARDIAL PERFUSION IMAGING
LV dias vol: 81 mL (ref 62–150)
LV sys vol: 34 mL
Peak HR: 96 {beats}/min
Rest HR: 57 {beats}/min
SDS: 3
SRS: 2
SSS: 5
TID: 1.02

## 2020-10-25 MED ORDER — TECHNETIUM TC 99M TETROFOSMIN IV KIT
10.6000 | PACK | Freq: Once | INTRAVENOUS | Status: AC | PRN
  Administered 2020-10-25: 10.6 via INTRAVENOUS
  Filled 2020-10-25: qty 11

## 2020-10-25 MED ORDER — TECHNETIUM TC 99M TETROFOSMIN IV KIT
31.2000 | PACK | Freq: Once | INTRAVENOUS | Status: AC | PRN
Start: 2020-10-25 — End: 2020-10-25
  Administered 2020-10-25: 31.2 via INTRAVENOUS
  Filled 2020-10-25: qty 32

## 2020-10-25 MED ORDER — REGADENOSON 0.4 MG/5ML IV SOLN
0.4000 mg | Freq: Once | INTRAVENOUS | Status: AC
  Administered 2020-10-25: 0.4 mg via INTRAVENOUS

## 2020-10-27 NOTE — Telephone Encounter (Signed)
Minimal = 1-2   Advise:  Try albuterol 15 min before an activity (on alternating days)  that you know would make you short of breath and see if it makes any difference and if makes none then don't take albuterol after activity unless you can't catch your breath as this means it's the resting that helps, not the albuterol.

## 2020-10-27 NOTE — Telephone Encounter (Signed)
Dr. Melvyn Novas, please see mychart messages sent by pt. Thanks!

## 2020-11-18 DIAGNOSIS — H04123 Dry eye syndrome of bilateral lacrimal glands: Secondary | ICD-10-CM | POA: Diagnosis not present

## 2020-11-18 DIAGNOSIS — H5010 Unspecified exotropia: Secondary | ICD-10-CM | POA: Diagnosis not present

## 2020-11-18 DIAGNOSIS — H43811 Vitreous degeneration, right eye: Secondary | ICD-10-CM | POA: Diagnosis not present

## 2020-11-18 DIAGNOSIS — Z961 Presence of intraocular lens: Secondary | ICD-10-CM | POA: Diagnosis not present

## 2020-11-18 DIAGNOSIS — E119 Type 2 diabetes mellitus without complications: Secondary | ICD-10-CM | POA: Diagnosis not present

## 2020-11-18 DIAGNOSIS — H02831 Dermatochalasis of right upper eyelid: Secondary | ICD-10-CM | POA: Diagnosis not present

## 2020-11-18 DIAGNOSIS — H02834 Dermatochalasis of left upper eyelid: Secondary | ICD-10-CM | POA: Diagnosis not present

## 2020-12-01 DIAGNOSIS — Z0100 Encounter for examination of eyes and vision without abnormal findings: Secondary | ICD-10-CM | POA: Diagnosis not present

## 2020-12-01 DIAGNOSIS — H524 Presbyopia: Secondary | ICD-10-CM | POA: Diagnosis not present

## 2020-12-09 DIAGNOSIS — N183 Chronic kidney disease, stage 3 unspecified: Secondary | ICD-10-CM | POA: Diagnosis not present

## 2020-12-09 DIAGNOSIS — E1151 Type 2 diabetes mellitus with diabetic peripheral angiopathy without gangrene: Secondary | ICD-10-CM | POA: Diagnosis not present

## 2020-12-09 DIAGNOSIS — E7800 Pure hypercholesterolemia, unspecified: Secondary | ICD-10-CM | POA: Diagnosis not present

## 2020-12-09 DIAGNOSIS — E119 Type 2 diabetes mellitus without complications: Secondary | ICD-10-CM | POA: Diagnosis not present

## 2020-12-09 DIAGNOSIS — D500 Iron deficiency anemia secondary to blood loss (chronic): Secondary | ICD-10-CM | POA: Diagnosis not present

## 2020-12-09 DIAGNOSIS — E782 Mixed hyperlipidemia: Secondary | ICD-10-CM | POA: Diagnosis not present

## 2020-12-09 DIAGNOSIS — N400 Enlarged prostate without lower urinary tract symptoms: Secondary | ICD-10-CM | POA: Diagnosis not present

## 2020-12-09 DIAGNOSIS — E1122 Type 2 diabetes mellitus with diabetic chronic kidney disease: Secondary | ICD-10-CM | POA: Diagnosis not present

## 2020-12-09 DIAGNOSIS — I10 Essential (primary) hypertension: Secondary | ICD-10-CM | POA: Diagnosis not present

## 2020-12-31 ENCOUNTER — Other Ambulatory Visit: Payer: Self-pay | Admitting: Cardiology

## 2021-01-04 MED ORDER — FLECAINIDE ACETATE 50 MG PO TABS
50.0000 mg | ORAL_TABLET | Freq: Two times a day (BID) | ORAL | 0 refills | Status: DC
Start: 2021-01-04 — End: 2021-01-13

## 2021-01-13 ENCOUNTER — Encounter: Payer: Self-pay | Admitting: Cardiology

## 2021-01-13 ENCOUNTER — Other Ambulatory Visit: Payer: Self-pay

## 2021-01-13 ENCOUNTER — Ambulatory Visit (INDEPENDENT_AMBULATORY_CARE_PROVIDER_SITE_OTHER): Payer: Medicare HMO

## 2021-01-13 ENCOUNTER — Ambulatory Visit (INDEPENDENT_AMBULATORY_CARE_PROVIDER_SITE_OTHER): Payer: Medicare HMO | Admitting: Cardiology

## 2021-01-13 VITALS — BP 122/74 | HR 56 | Ht 68.0 in | Wt 205.0 lb

## 2021-01-13 DIAGNOSIS — I493 Ventricular premature depolarization: Secondary | ICD-10-CM

## 2021-01-13 MED ORDER — PROPAFENONE HCL ER 325 MG PO CP12: 325.0000 mg | ORAL_CAPSULE | Freq: Two times a day (BID) | ORAL | 3 refills | Status: DC

## 2021-01-13 NOTE — Progress Notes (Signed)
Electrophysiology Office Note   Date:  01/13/2021   ID:  TEKOA KOVAC, DOB Apr 15, 1941, MRN [id number]  PCP:  Seward Carol, MD  Cardiologist:   Primary Electrophysiologist:  Zaelyn Barbary Meredith Leeds, MD    Chief Complaint: PVC   History of Present Illness: Jesse James is a 80 y.o. male who is being seen today for the evaluation of PVC at the request of Seward Carol, MD. Presenting today for electrophysiology evaluation.  He has a history significant for tobacco abuse, TIA, coronary artery disease, GERD, type 2 diabetes, hypertension, hyperlipidemia, PAD, CKD stage III, and bradycardia.  He presented to the hospital 09/08/2020 with slow heart rates.  He had been asymptomatic until 3 weeks prior.  He had been completing and high intensity interval training on a daily basis.  He was able to get his heart rates in the 130s with exercise.  He wore a cardiac monitor that showed a 20% PVC burden.  He has since been started on flecainide.  Today, denies symptoms of palpitations, chest pain, shortness of breath, orthopnea, PND, lower extremity edema, claudication, dizziness, presyncope, syncope, bleeding, or neurologic sequela. The patient is tolerating medications without difficulties.  Since being seen he feels a little bit better.  He does continue to have a few PVCs on his ECG today.  He has no chest pain, but does have significant fatigue.  He states that when he exercises on his bike, he is unable to get his heart rate above around 60 bpm.  Aside from that, he has no major complaints.   Past Medical History:  Diagnosis Date   Bradycardia    Chronic kidney disease (CKD), stage III (moderate) (HCC)    GERD (gastroesophageal reflux disease)    Hyperlipidemia    Hypertension    Peripheral vascular disease (HCC)    TIA (transient ischemic attack)    Type 2 diabetes mellitus (HCC)    Past Surgical History:  Procedure Laterality Date   APPENDECTOMY     tonslectomy       Current  Outpatient Medications  Medication Sig Dispense Refill   ALBUTEROL IN Inhale 2 puffs into the lungs every 4 (four) hours as needed.     aspirin EC 81 MG tablet Take 1 tablet (81 mg total) by mouth daily. Swallow whole. 90 tablet 3   chlorthalidone (HYGROTON) 25 MG tablet Take 1 tablet by mouth every morning.     clopidogrel (PLAVIX) 75 MG tablet Take 1 tablet (75 mg total) by mouth daily. 90 tablet 3   metFORMIN (GLUCOPHAGE-XR) 500 MG 24 hr tablet TAKE 2 TABLETS ONE TIME DAILY WITH FOOD     propafenone (RYTHMOL SR) 325 MG 12 hr capsule Take 1 capsule (325 mg total) by mouth 2 (two) times daily. 180 capsule 3   simvastatin (ZOCOR) 80 MG tablet 1 tablet     No current facility-administered medications for this visit.    Allergies:   Patient has no known allergies.   Social History:  The patient  reports that he has been smoking cigarettes. He has a 60.00 pack-year smoking history. He has never used smokeless tobacco. He reports that he does not drink alcohol and does not use drugs.   Family History:  The patient's family history includes Diabetes in his sister; Diabetes type II in his father; Heart attack (age of onset: 33) in his father; Heart attack (age of onset: 23) in his brother; Heart disease in his mother.   ROS:  Please see the  history of present illness.   Otherwise, review of systems is positive for none.   All other systems are reviewed and negative.   PHYSICAL EXAM: VS:  BP 122/74   Pulse (!) 56   Ht '5\' 8"'$  (1.727 m)   Wt 205 lb (93 kg)   SpO2 98%   BMI 31.17 kg/m  , BMI Body mass index is 31.17 kg/m. GEN: Well nourished, well developed, in no acute distress  HEENT: normal  Neck: no JVD, carotid bruits, or masses Cardiac: RRR; no murmurs, rubs, or gallops,no edema  Respiratory:  clear to auscultation bilaterally, normal work of breathing GI: soft, nontender, nondistended, + BS MS: no deformity or atrophy  Skin: warm and dry Neuro:  Strength and sensation are  intact Psych: euthymic mood, full affect  EKG:  EKG is ordered today. Personal review of the ekg ordered shows sinus rhythm, rate 56, PVCs   Recent Labs: 09/08/2020: ALT 15; BUN 16; Creatinine, Ser 1.18; Hemoglobin 16.5; Platelets 160; Potassium 3.7; Sodium 130    Lipid Panel     Component Value Date/Time   CHOL 108 10/25/2020 0736   TRIG 62 10/25/2020 0736   HDL 46 10/25/2020 0736   CHOLHDL 2.3 10/25/2020 0736   LDLCALC 48 10/25/2020 0736     Wt Readings from Last 3 Encounters:  01/13/21 205 lb (93 kg)  10/25/20 200 lb (90.7 kg)  10/12/20 200 lb 9.6 oz (91 kg)      Other studies Reviewed: Additional studies/ records that were reviewed today include: TTE 10/04/20  Review of the above records today demonstrates:   1. Left ventricular ejection fraction, by estimation, is 60 to 65%. The  left ventricle has normal function. The left ventricle has no regional  wall motion abnormalities. Left ventricular diastolic parameters were  normal.   2. Right ventricular systolic function is normal. The right ventricular  size is normal. There is normal pulmonary artery systolic pressure.   3. Left atrial size was mildly dilated.   4. The mitral valve is normal in structure. Trivial mitral valve  regurgitation. No evidence of mitral stenosis.   5. The aortic valve is tricuspid. There is mild calcification of the  aortic valve. Aortic valve regurgitation is not visualized. Mild aortic  valve sclerosis is present, with no evidence of aortic valve stenosis.   6. The inferior vena cava is normal in size with greater than 50%  respiratory variability, suggesting right atrial pressure of 3 mmHg.   Cardiac monitor personally reviewed Max 135 bpm  Min 41 bpm Average 57 bpm 5.7% supraventricular ectopy 20.3% ventricular ectopy Predominant underlying rhythm was sinus rhythm symptoms associated with ventricular bigeminy  TTE 02/18/2020 Mild concentric LVH Ejection fraction 55 to  60% Mildly dilated left atrium Mild aortic valve sclerosis .  Good ASSESSMENT AND PLAN:  1.  PVCs: Troponins were negative in the emergency room.  ECG without ischemic changes.  Were cardiac monitor that showed a 20% burden.  Currently on flecainide.  High risk medication monitoring.  He is continue to have episodes of fatigue.  He feels on the flecainide may be contributing.  We Isao Seltzer stop flecainide and start him on propafenone 325 mg.  We Edmon Magid repeat a cardiac monitor for PVC burden and bradycardia.  At this point it is difficult to tell whether or not his symptoms are due to elevated PVCs or bradycardia.  2.  Hypertension: Currently well controlled  3.  Hyperlipidemia: Continue simvastatin per primary physician.   Current medicines  are reviewed at length with the patient today.   The patient does not have concerns regarding his medicines.  The following changes were made today: Stop flecainide, start propafenone  Labs/ tests ordered today include:  Orders Placed This Encounter  Procedures   LONG TERM MONITOR (3-14 DAYS)   EKG 12-Lead      Disposition:   FU with Corsica Franson 3 months  Signed, Joshuajames Moehring Meredith Leeds, MD  01/13/2021 9:10 AM     [facility] [address] [place] [address] [phone] (office) [phone] (fax)

## 2021-01-13 NOTE — Patient Instructions (Addendum)
Medication Instructions:  Your physician has recommended you make the following change in your medication:  STOP Flecainide START Rythmol (Propafenone) 325 mg twice daily  *If you need a refill on your cardiac medications before your next appointment, please call your pharmacy*   Lab Work: None Ordered If you have labs (blood work) drawn today and your tests are completely normal, you will receive your results only by: [place] (if you have MyChart) OR A paper copy in the mail If you have any lab test that is abnormal or we need to change your treatment, we will call you to review the results.   Testing/Procedures: ZIO XT- Long Term Monitor Instructions - APPLY MONITOR 2 WEEKS AFTER STARTING RYTHMOL (Propafenone)  Your physician has requested you wear a ZIO patch monitor for 14 days.  This is a single patch monitor. [employer] supplies one patch monitor per enrollment. Additional stickers are not available. Please do not apply patch if you will be having a Nuclear Stress Test,  Echocardiogram, Cardiac CT, MRI, or Chest Xray during the period you would be wearing the  monitor. The patch cannot be worn during these tests. You cannot remove and re-apply the  ZIO XT patch monitor.  Your ZIO patch monitor will be mailed 3 day USPS to your address on file. It may take 3-5 days  to receive your monitor after you have been enrolled.  Once you have received your monitor, please review the enclosed instructions. Your monitor  has already been registered assigning a specific monitor serial # to you.  Billing and Patient Assistance Program Information  We have supplied [employer] with any of your insurance information on file for billing purposes. [employer] offers a sliding scale Patient Assistance Program for patients that do not have  insurance, or whose insurance does not completely cover the cost of the ZIO monitor.  You must apply for the Patient Assistance Program to qualify for this  discounted rate.  To apply, please call [employer] at [phone], select option 4, select option 2, ask to apply for  Patient Assistance Program. Theodore Demark will ask your household income, and how many people  are in your household. They will quote your out-of-pocket cost based on that information.  [employer] will also be able to set up a 34-month interest-free payment plan if needed.  Applying the monitor   Shave hair from upper left chest.  Hold abrader disc by orange tab. Rub abrader in 40 strokes over the upper left chest as  indicated in your monitor instructions.  Clean area with 4 enclosed alcohol pads. Let dry.  Apply patch as indicated in monitor instructions. Patch will be placed under collarbone on left  side of chest with arrow pointing upward.  Rub patch adhesive wings for 2 minutes. Remove white label marked "1". Remove the white  label marked "2". Rub patch adhesive wings for 2 additional minutes.  While looking in a mirror, press and release button in center of patch. A small green light will  flash 3-4 times. This will be your only indicator that the monitor has been turned on.  Do not shower for the first 24 hours. You may shower after the first 24 hours.  Press the button if you feel a symptom. You will hear a small click. Record Date, Time and  Symptom in the Patient Logbook.  When you are ready to remove the patch, follow instructions on the last 2 pages of Patient  Logbook. Stick patch monitor onto the last  page of Patient Logbook.  Place Patient Logbook in the blue and white box. Use locking tab on box and tape box closed  securely. The blue and white box has prepaid postage on it. Please place it in the mailbox as  soon as possible. Your physician should have your test results approximately 7 days after the  monitor has been mailed back to [facility].  Call [place] at [phone] if you have questions regarding  your ZIO XT patch monitor. Call  them immediately if you see an orange light blinking on your  monitor.  If your monitor falls off in less than 4 days, contact our Monitor department at [phone].  If your monitor becomes loose or falls off after 4 days call [employer] at [phone] for  suggestions on securing your monitor    Follow-Up: At [facility], you and your health needs are our priority.  As part of our continuing mission to provide you with exceptional heart care, we have created designated Provider Care Teams.  These Care Teams include your primary Cardiologist (physician) and Advanced Practice Providers (APPs -  Physician Assistants and Nurse Practitioners) who all work together to provide you with the care you need, when you need it.   Your next appointment:   3 month(s) on Tuesday Dec. 6 at 11:00 am  The format for your next appointment:   In Person  Provider:   Allegra Lai, MD

## 2021-01-13 NOTE — Progress Notes (Unsigned)
Patient enrolled for [employer] to mail a 14 day ZIO XT monitor to his home.  Patient to apply monitor 2 weeks after starting Rhythmol.  Requested start date of 01/26/21.

## 2021-01-14 DIAGNOSIS — K2270 Barrett's esophagus without dysplasia: Secondary | ICD-10-CM | POA: Diagnosis not present

## 2021-02-04 NOTE — Telephone Encounter (Signed)
Pt reports that he has not received a monitor yet. Feeling better and taking Propafenone.  Aware I will forward to monitor team to address possible lost monitor.Jesse KitchenMarland James

## 2021-02-11 DIAGNOSIS — I493 Ventricular premature depolarization: Secondary | ICD-10-CM

## 2021-03-03 DIAGNOSIS — R001 Bradycardia, unspecified: Secondary | ICD-10-CM | POA: Diagnosis not present

## 2021-03-03 DIAGNOSIS — I739 Peripheral vascular disease, unspecified: Secondary | ICD-10-CM | POA: Diagnosis not present

## 2021-03-03 DIAGNOSIS — E1151 Type 2 diabetes mellitus with diabetic peripheral angiopathy without gangrene: Secondary | ICD-10-CM | POA: Diagnosis not present

## 2021-03-03 DIAGNOSIS — I10 Essential (primary) hypertension: Secondary | ICD-10-CM | POA: Diagnosis not present

## 2021-03-03 DIAGNOSIS — E1122 Type 2 diabetes mellitus with diabetic chronic kidney disease: Secondary | ICD-10-CM | POA: Diagnosis not present

## 2021-03-03 DIAGNOSIS — E663 Overweight: Secondary | ICD-10-CM | POA: Diagnosis not present

## 2021-03-03 DIAGNOSIS — G459 Transient cerebral ischemic attack, unspecified: Secondary | ICD-10-CM | POA: Diagnosis not present

## 2021-03-03 DIAGNOSIS — E7800 Pure hypercholesterolemia, unspecified: Secondary | ICD-10-CM | POA: Diagnosis not present

## 2021-03-03 DIAGNOSIS — I700 Atherosclerosis of aorta: Secondary | ICD-10-CM | POA: Diagnosis not present

## 2021-03-03 DIAGNOSIS — Z7984 Long term (current) use of oral hypoglycemic drugs: Secondary | ICD-10-CM | POA: Diagnosis not present

## 2021-03-07 DIAGNOSIS — I493 Ventricular premature depolarization: Secondary | ICD-10-CM | POA: Diagnosis not present

## 2021-03-16 NOTE — Telephone Encounter (Signed)
Patient is returning call.  °

## 2021-03-16 NOTE — Telephone Encounter (Signed)
Pt reports constipation since starting Propafenone but this has improved since starting prunes. He is currently having a bowel movement every other day and is comfortable with that regimen.  His biggest concern is the lack of energy since early this year.  Seems to be worsening. Aware recent monitoring showed a decrease in his PVC burden. Aware will forward to MD for advisement in relation to the lack of energy pt is experiencing. Pt is scheduled for a 3 mo follow up on 12/6. Pt aware I will be in touch once MD reviews/advises, pt agreeable to plan.

## 2021-03-16 NOTE — Telephone Encounter (Signed)
Left message to call back  

## 2021-04-19 ENCOUNTER — Encounter: Payer: Self-pay | Admitting: Cardiology

## 2021-04-19 ENCOUNTER — Other Ambulatory Visit: Payer: Self-pay

## 2021-04-19 ENCOUNTER — Ambulatory Visit: Payer: Medicare HMO | Admitting: Cardiology

## 2021-04-19 VITALS — BP 156/70 | HR 69 | Ht 68.0 in | Wt 207.6 lb

## 2021-04-19 DIAGNOSIS — I493 Ventricular premature depolarization: Secondary | ICD-10-CM | POA: Diagnosis not present

## 2021-04-19 NOTE — Progress Notes (Signed)
Electrophysiology Office Note   Date:  04/19/2021   ID:  Jesse James, DOB 1940-11-16, MRN [id number]  PCP:  Seward Carol, MD  Cardiologist:   Primary Electrophysiologist:  Coy Rochford Meredith Leeds, MD    Chief Complaint: PVC   History of Present Illness: Jesse James is a 80 y.o. male who is being seen today for the evaluation of PVC at the request of Seward Carol, MD. Presenting today for electrophysiology evaluation.  He has a history significant for tobacco abuse, TIA, coronary artery disease, GERD, type 2 diabetes, hypertension, hyperlipidemia, PAD, CKD stage III, bradycardia.  He presented to the hospital 09/08/2020 with slow heart rates.  He was found to have an elevated PVC burden.  He wore a cardiac monitor with a 20% burden and was started on flecainide.  He had side effects of flecainide and has been switched to propafenone.  Today, denies symptoms of palpitations, chest pain, shortness of breath, orthopnea, PND, lower extremity edema, claudication, dizziness, presyncope, syncope, bleeding, or neurologic sequela. The patient is tolerating medications without difficulties.  He is currently feeling well.  He is unaware of palpitations.  He does have fatigue, though he attributes this to his age of 17.  He has no chest pain or shortness of breath.  He is overall happy with his control of PVCs.   Past Medical History:  Diagnosis Date   Bradycardia    Chronic kidney disease (CKD), stage III (moderate) (HCC)    GERD (gastroesophageal reflux disease)    Hyperlipidemia    Hypertension    Peripheral vascular disease (HCC)    TIA (transient ischemic attack)    Type 2 diabetes mellitus (HCC)    Past Surgical History:  Procedure Laterality Date   APPENDECTOMY     tonslectomy       Current Outpatient Medications  Medication Sig Dispense Refill   ALBUTEROL IN Inhale 2 puffs into the lungs every 4 (four) hours as needed.     aspirin EC 81 MG tablet Take 1 tablet (81 mg  total) by mouth daily. Swallow whole. 90 tablet 3   chlorthalidone (HYGROTON) 25 MG tablet Take 1 tablet by mouth every morning.     clopidogrel (PLAVIX) 75 MG tablet Take 1 tablet (75 mg total) by mouth daily. 90 tablet 3   metFORMIN (GLUCOPHAGE-XR) 500 MG 24 hr tablet TAKE 2 TABLETS ONE TIME DAILY WITH FOOD     pantoprazole (PROTONIX) 20 MG tablet Take 1 tablet by mouth every morning.     propafenone (RYTHMOL SR) 325 MG 12 hr capsule Take 1 capsule (325 mg total) by mouth 2 (two) times daily. 180 capsule 3   simvastatin (ZOCOR) 80 MG tablet 1 tablet     No current facility-administered medications for this visit.    Allergies:   Patient has no known allergies.   Social History:  The patient  reports that he has been smoking cigarettes. He has a 60.00 pack-year smoking history. He has never used smokeless tobacco. He reports that he does not drink alcohol and does not use drugs.   Family History:  The patient's family history includes Diabetes in his sister; Diabetes type II in his father; Heart attack (age of onset: 38) in his father; Heart attack (age of onset: 75) in his brother; Heart disease in his mother.   ROS:  Please see the history of present illness.   Otherwise, review of systems is positive for none.   All other systems are reviewed and negative.  PHYSICAL EXAM: VS:  BP (!) 156/70   Pulse 69   Ht 5\' 8"  (1.727 m)   Wt 207 lb 9.6 oz (94.2 kg)   SpO2 98%   BMI 31.57 kg/m  , BMI Body mass index is 31.57 kg/m. GEN: Well nourished, well developed, in no acute distress  HEENT: normal  Neck: no JVD, carotid bruits, or masses Cardiac: RRR; no murmurs, rubs, or gallops,no edema  Respiratory:  clear to auscultation bilaterally, normal work of breathing GI: soft, nontender, nondistended, + BS MS: no deformity or atrophy  Skin: warm and dry Neuro:  Strength and sensation are intact Psych: euthymic mood, full affect  EKG:  EKG is ordered today. Personal review of the ekg  ordered shows sinus rhythm, PVCs   Recent Labs: 09/08/2020: ALT 15; BUN 16; Creatinine, Ser 1.18; Hemoglobin 16.5; Platelets 160; Potassium 3.7; Sodium 130    Lipid Panel     Component Value Date/Time   CHOL 108 10/25/2020 0736   TRIG 62 10/25/2020 0736   HDL 46 10/25/2020 0736   CHOLHDL 2.3 10/25/2020 0736   LDLCALC 48 10/25/2020 0736     Wt Readings from Last 3 Encounters:  04/19/21 207 lb 9.6 oz (94.2 kg)  01/13/21 205 lb (93 kg)  10/25/20 200 lb (90.7 kg)      Other studies Reviewed: Additional studies/ records that were reviewed today include: TTE 10/04/20  Review of the above records today demonstrates:   1. Left ventricular ejection fraction, by estimation, is 60 to 65%. The  left ventricle has normal function. The left ventricle has no regional  wall motion abnormalities. Left ventricular diastolic parameters were  normal.   2. Right ventricular systolic function is normal. The right ventricular  size is normal. There is normal pulmonary artery systolic pressure.   3. Left atrial size was mildly dilated.   4. The mitral valve is normal in structure. Trivial mitral valve  regurgitation. No evidence of mitral stenosis.   5. The aortic valve is tricuspid. There is mild calcification of the  aortic valve. Aortic valve regurgitation is not visualized. Mild aortic  valve sclerosis is present, with no evidence of aortic valve stenosis.   6. The inferior vena cava is normal in size with greater than 50%  respiratory variability, suggesting right atrial pressure of 3 mmHg.   Cardiac monitor 03/11/2021 personally reviewed <1% supraventricular ectopy 7.1% ventricular ectopy Predominant rhythm was sinus rhythm Shortness of breath associated with sinus rhythm  ASSESSMENT AND PLAN:  1.  PVCs: 20% burden.  Currently on propafenone 325 mg twice daily.  High risk medication monitoring via ECG for propafenone.  Repeat monitor shows a reduced burden down to 7%.  He is overall  happy with his control.  We Almin Livingstone continue with current management.  2.  Hypertension: Mildly elevated today.  Usually well controlled.  No changes.  Plan per primary physician.  3.  Hyperlipidemia: Continue simvastatin per primary physician.   Current medicines are reviewed at length with the patient today.   The patient does not have concerns regarding his medicines.  The following changes were made today: None  Labs/ tests ordered today include:  Orders Placed This Encounter  Procedures   EKG 12-Lead       Disposition:   FU with Bronco Mcgrory 6 months  Signed, Cabot Cromartie Meredith Leeds, MD  04/19/2021 11:12 AM     [facility] [address] [place] [address] [phone] (office) [phone] (fax)

## 2021-04-19 NOTE — Patient Instructions (Addendum)
Medication Instructions:  Your physician recommends that you continue on your current medications as directed. Please refer to the Current Medication list given to you today.  *If you need a refill on your cardiac medications before your next appointment, please call your pharmacy*   Lab Work: None ordered   Testing/Procedures: None ordered   Follow-Up: At [facility], you and your health needs are our priority.  As part of our continuing mission to provide you with exceptional heart care, we have created designated Provider Care Teams.  These Care Teams include your primary Cardiologist (physician) and Advanced Practice Providers (APPs -  Physician Assistants and Nurse Practitioners) who all work together to provide you with the care you need, when you need it.  Your next appointment:   6 month(s)  The format for your next appointment:   In Person  Provider:   You will see one of the following Advanced Practice Providers on your designated Care Team:   Renee Ursuy, PA-C Michael "Andy" Tillery, PA-C   Thank you for choosing [facility]!!   Harnoor Reta, RN [phone]         

## 2021-04-20 ENCOUNTER — Other Ambulatory Visit: Payer: Self-pay | Admitting: *Deleted

## 2021-04-21 ENCOUNTER — Telehealth: Payer: Self-pay

## 2021-04-21 NOTE — Telephone Encounter (Signed)
**Note De-Identified Jesse James Obfuscation** Propafenone tier exception started through covermymeds. Key: [id number]

## 2021-04-29 NOTE — Telephone Encounter (Signed)
Received a fax from [facility]... they have denied the tier exception but will cover the plain Propafenone that is not ER at a cheaper copay.   I called the pharmacy and the cost of the ER is $229.47 for a #90 day supply.  The cost of the plain Propafenone is $125. For a #90 supply but the strength is 300mg  where the ER is 325mg .   I called and spoke to the pt and he is ok with paying for the ER since that is what was prescribed by WC.   The ER was shipped to the pt on 04/23/21.

## 2021-05-05 DIAGNOSIS — I10 Essential (primary) hypertension: Secondary | ICD-10-CM | POA: Diagnosis not present

## 2021-05-05 DIAGNOSIS — G459 Transient cerebral ischemic attack, unspecified: Secondary | ICD-10-CM | POA: Diagnosis not present

## 2021-05-05 DIAGNOSIS — E1122 Type 2 diabetes mellitus with diabetic chronic kidney disease: Secondary | ICD-10-CM | POA: Diagnosis not present

## 2021-05-05 DIAGNOSIS — E782 Mixed hyperlipidemia: Secondary | ICD-10-CM | POA: Diagnosis not present

## 2021-05-05 DIAGNOSIS — E119 Type 2 diabetes mellitus without complications: Secondary | ICD-10-CM | POA: Diagnosis not present

## 2021-05-05 DIAGNOSIS — N400 Enlarged prostate without lower urinary tract symptoms: Secondary | ICD-10-CM | POA: Diagnosis not present

## 2021-05-05 DIAGNOSIS — D500 Iron deficiency anemia secondary to blood loss (chronic): Secondary | ICD-10-CM | POA: Diagnosis not present

## 2021-05-05 DIAGNOSIS — E1151 Type 2 diabetes mellitus with diabetic peripheral angiopathy without gangrene: Secondary | ICD-10-CM | POA: Diagnosis not present

## 2021-05-05 DIAGNOSIS — N183 Chronic kidney disease, stage 3 unspecified: Secondary | ICD-10-CM | POA: Diagnosis not present

## 2021-05-18 ENCOUNTER — Ambulatory Visit (HOSPITAL_COMMUNITY)
Admission: EM | Admit: 2021-05-18 | Discharge: 2021-05-18 | Disposition: A | Discharge status: Home / Self Care | Payer: Medicare HMO | Attending: Family Medicine | Admitting: Family Medicine

## 2021-05-18 ENCOUNTER — Encounter (HOSPITAL_COMMUNITY): Payer: Self-pay

## 2021-05-18 ENCOUNTER — Other Ambulatory Visit: Payer: Self-pay

## 2021-05-18 DIAGNOSIS — S61239A Puncture wound without foreign body of unspecified finger without damage to nail, initial encounter: Secondary | ICD-10-CM

## 2021-05-18 DIAGNOSIS — S61230A Puncture wound without foreign body of right index finger without damage to nail, initial encounter: Secondary | ICD-10-CM | POA: Diagnosis not present

## 2021-05-18 NOTE — ED Triage Notes (Signed)
Pt presents with an injury to the R index finger x 2 days.   Pt states he was taking a top off his radiator and states his finger became sore and states his finger was punctured.

## 2021-05-19 ENCOUNTER — Encounter: Payer: Self-pay | Admitting: Cardiology

## 2021-05-19 NOTE — ED Provider Notes (Signed)
[place]   [id number] 05/18/21 Arrival Time: 4098  ASSESSMENT & PLAN:  1. Puncture wound of finger, right, initial encounter    No signs of infection. Skin intact. Very small area of bruising over distal R index finger. He is comfortable with home observation. No tx needed at this time.  Recommend:  Follow-up Information     Seward Carol, MD.   Specialty: Internal Medicine Why: As needed. Contact information: [address] [place] [address] [phone]                OTC Tylenol if needed.  Reviewed expectations re: course of current medical issues. Questions answered. Outlined signs and symptoms indicating need for more acute intervention. Patient verbalized understanding. After Visit Summary given.  SUBJECTIVE: History from: patient. Jesse James is a 81 y.o. male who reports injury/mild pain of distal volar R 2nd finger; 2 d ago. No bleeding. "Just wanted it checked". No extremity sensation changes or weakness.    Past Surgical History:  Procedure Laterality Date   APPENDECTOMY     tonslectomy        OBJECTIVE:  Vitals:   05/18/21 1842 05/18/21 1847  BP: (!) 181/52 (!) 170/56  Pulse: (!) 52   Temp: 98.3 F (36.8 C)   TempSrc: Oral   SpO2: 99%     General appearance: alert; no distress HEENT: [place]; AT Neck: supple with FROM Resp: unlabored respirations Extremities: R 2nd finger: normal ROM; approx 2 mm circular bruising of finger pad; normal cap refill and sensation Psychological: alert and cooperative; normal mood and affect   No Known Allergies  Past Medical History:  Diagnosis Date   Bradycardia    Chronic kidney disease (CKD), stage III (moderate) (HCC)    GERD (gastroesophageal reflux disease)    Hyperlipidemia    Hypertension    Peripheral vascular disease (HCC)    TIA (transient ischemic attack)    Type 2 diabetes mellitus (HCC)    Social History   Socioeconomic History   Marital  status: Married    Spouse name: Not on file   Number of children: Not on file   Years of education: Not on file   Highest education level: Not on file  Occupational History   Not on file  Tobacco Use   Smoking status: Every Day    Packs/day: 1.00    Years: 60.00    Pack years: 60.00    Types: Cigarettes   Smokeless tobacco: Never   Tobacco comments:    4 cigs per day//lmr  Vaping Use   Vaping Use: Every day   Substances: Nicotine-salt  Substance and Sexual Activity   Alcohol use: No    Comment: 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   Drug use: No   Sexual activity: Not on file  Other Topics Concern   Not on file  Social History Narrative   Not on file   Social Determinants of Health   Financial Resource Strain: Not on file  Food Insecurity: Not on file  Transportation Needs: Not on file  Physical Activity: Not on file  Stress: Not on file  Social Connections: Not on file   Family History  Problem Relation Age of Onset   Heart disease Mother        Found later in life, thought it was a congenital defect found later   Heart attack Father 47   Diabetes type II Father    Heart attack  Brother 38   Diabetes Sister    Past Surgical History:  Procedure Laterality Date   APPENDECTOMY     Su Ley, MD 05/19/21 [phone]

## 2021-05-20 ENCOUNTER — Ambulatory Visit: Payer: Medicare HMO | Admitting: Cardiology

## 2021-05-23 ENCOUNTER — Telehealth: Payer: Self-pay | Admitting: Cardiology

## 2021-05-23 NOTE — Telephone Encounter (Signed)
Pt c/o BP issue:  1. What are your last 5 BP readings? 163/63, 172/71, 166/57 pulse 45, 170/63 pulse 48, 165/74 pulse 50  130/59  2. Are you having any other symptoms (ex. Dizziness, headache, blurred vision, passed out)? no 3. What is your medication issue? Blood pressure is high for him   Patient says his blood pressure have been running high. He wants to know if he needs to come in and let somebody check it?

## 2021-05-23 NOTE — Telephone Encounter (Signed)
Late entry, spoke to patient shortly after 1 pm this afternoon. Advised to follow up with PCP on this matter. Patient verbalized understanding and agreeable to plan.

## 2021-05-26 DIAGNOSIS — I10 Essential (primary) hypertension: Secondary | ICD-10-CM | POA: Diagnosis not present

## 2021-07-24 ENCOUNTER — Encounter: Payer: Self-pay | Admitting: Cardiology

## 2021-07-24 DIAGNOSIS — R001 Bradycardia, unspecified: Secondary | ICD-10-CM

## 2021-07-26 ENCOUNTER — Ambulatory Visit (INDEPENDENT_AMBULATORY_CARE_PROVIDER_SITE_OTHER): Payer: Medicare HMO

## 2021-07-26 DIAGNOSIS — R001 Bradycardia, unspecified: Secondary | ICD-10-CM

## 2021-07-26 NOTE — Progress Notes (Unsigned)
Enrolled for [employer] to mail a ZIO XT long term holter monitor to the patients address on file.  

## 2021-07-29 DIAGNOSIS — R001 Bradycardia, unspecified: Secondary | ICD-10-CM | POA: Diagnosis not present

## 2021-08-10 ENCOUNTER — Encounter: Payer: Self-pay | Admitting: Cardiology

## 2021-08-10 NOTE — Telephone Encounter (Signed)
Left message to call back  

## 2021-08-13 DIAGNOSIS — R001 Bradycardia, unspecified: Secondary | ICD-10-CM | POA: Diagnosis not present

## 2021-08-18 ENCOUNTER — Telehealth: Payer: Self-pay | Admitting: Cardiology

## 2021-08-18 NOTE — Telephone Encounter (Signed)
Patient calling to follow up in regards his to [facility] message sent about monitor results. ?

## 2021-08-18 NOTE — Telephone Encounter (Signed)
Pt would like to decrease his Propafenone to 225 mg to see if he feels any better on decreased dose.  ?Advised that Dr. Curt Bears does not feel his symptoms are r/t this pt still insistent on reducing. ?Forwarding to MD for review/advisement. ? ?

## 2021-08-19 MED ORDER — PROPAFENONE HCL ER 225 MG PO CP12: 225.0000 mg | ORAL_CAPSULE | Freq: Two times a day (BID) | ORAL | 1 refills | Status: DC

## 2021-08-19 NOTE — Telephone Encounter (Signed)
Pt advised Dr. Curt Bears ok to reduce to 225 mg and see if improvement. ?Patient verbalized understanding and agreeable to plan.  ?Advised to let  us know how he is doing after dose decrease, pt agreeable.  ?

## 2021-08-23 ENCOUNTER — Other Ambulatory Visit: Payer: Self-pay | Admitting: *Deleted

## 2021-08-23 MED ORDER — PROPAFENONE HCL ER 225 MG PO CP12: 225.0000 mg | ORAL_CAPSULE | Freq: Two times a day (BID) | ORAL | 1 refills | Status: DC

## 2021-08-29 DIAGNOSIS — J439 Emphysema, unspecified: Secondary | ICD-10-CM | POA: Diagnosis not present

## 2021-08-29 DIAGNOSIS — I10 Essential (primary) hypertension: Secondary | ICD-10-CM | POA: Diagnosis not present

## 2021-08-29 DIAGNOSIS — E7800 Pure hypercholesterolemia, unspecified: Secondary | ICD-10-CM | POA: Diagnosis not present

## 2021-08-29 DIAGNOSIS — R5383 Other fatigue: Secondary | ICD-10-CM | POA: Diagnosis not present

## 2021-08-29 DIAGNOSIS — E1151 Type 2 diabetes mellitus with diabetic peripheral angiopathy without gangrene: Secondary | ICD-10-CM | POA: Diagnosis not present

## 2021-08-29 DIAGNOSIS — R0989 Other specified symptoms and signs involving the circulatory and respiratory systems: Secondary | ICD-10-CM | POA: Diagnosis not present

## 2021-08-29 DIAGNOSIS — I739 Peripheral vascular disease, unspecified: Secondary | ICD-10-CM | POA: Diagnosis not present

## 2021-08-29 DIAGNOSIS — I700 Atherosclerosis of aorta: Secondary | ICD-10-CM | POA: Diagnosis not present

## 2021-09-05 DIAGNOSIS — Z1389 Encounter for screening for other disorder: Secondary | ICD-10-CM | POA: Diagnosis not present

## 2021-09-05 DIAGNOSIS — Z0000 Encounter for general adult medical examination without abnormal findings: Secondary | ICD-10-CM | POA: Diagnosis not present

## 2021-09-13 ENCOUNTER — Ambulatory Visit (HOSPITAL_COMMUNITY)
Admission: RE | Admit: 2021-09-13 | Discharge: 2021-09-13 | Disposition: A | Discharge status: Home / Self Care | Payer: Medicare HMO | Source: Ambulatory Visit | Attending: Internal Medicine | Admitting: Internal Medicine

## 2021-09-13 DIAGNOSIS — I6523 Occlusion and stenosis of bilateral carotid arteries: Secondary | ICD-10-CM | POA: Diagnosis not present

## 2021-09-22 ENCOUNTER — Ambulatory Visit: Payer: Medicare HMO | Admitting: Podiatry

## 2021-09-22 ENCOUNTER — Encounter: Payer: Self-pay | Admitting: Podiatry

## 2021-09-22 DIAGNOSIS — B351 Tinea unguium: Secondary | ICD-10-CM

## 2021-09-22 DIAGNOSIS — D500 Iron deficiency anemia secondary to blood loss (chronic): Secondary | ICD-10-CM | POA: Insufficient documentation

## 2021-09-22 DIAGNOSIS — Z860100 Personal history of colon polyps, unspecified: Secondary | ICD-10-CM | POA: Insufficient documentation

## 2021-09-22 DIAGNOSIS — N183 Chronic kidney disease, stage 3 unspecified: Secondary | ICD-10-CM | POA: Insufficient documentation

## 2021-09-22 DIAGNOSIS — Z1389 Encounter for screening for other disorder: Secondary | ICD-10-CM | POA: Insufficient documentation

## 2021-09-22 DIAGNOSIS — E1151 Type 2 diabetes mellitus with diabetic peripheral angiopathy without gangrene: Secondary | ICD-10-CM | POA: Insufficient documentation

## 2021-09-22 DIAGNOSIS — N400 Enlarged prostate without lower urinary tract symptoms: Secondary | ICD-10-CM | POA: Insufficient documentation

## 2021-09-22 DIAGNOSIS — M79676 Pain in unspecified toe(s): Secondary | ICD-10-CM | POA: Diagnosis not present

## 2021-09-22 DIAGNOSIS — K2270 Barrett's esophagus without dysplasia: Secondary | ICD-10-CM | POA: Insufficient documentation

## 2021-09-22 DIAGNOSIS — K921 Melena: Secondary | ICD-10-CM | POA: Insufficient documentation

## 2021-09-22 DIAGNOSIS — Z720 Tobacco use: Secondary | ICD-10-CM | POA: Insufficient documentation

## 2021-09-22 DIAGNOSIS — R195 Other fecal abnormalities: Secondary | ICD-10-CM | POA: Insufficient documentation

## 2021-09-22 DIAGNOSIS — E782 Mixed hyperlipidemia: Secondary | ICD-10-CM | POA: Insufficient documentation

## 2021-09-22 DIAGNOSIS — I10 Essential (primary) hypertension: Secondary | ICD-10-CM | POA: Insufficient documentation

## 2021-09-22 DIAGNOSIS — I517 Cardiomegaly: Secondary | ICD-10-CM | POA: Insufficient documentation

## 2021-09-22 DIAGNOSIS — E7800 Pure hypercholesterolemia, unspecified: Secondary | ICD-10-CM | POA: Insufficient documentation

## 2021-09-22 DIAGNOSIS — I779 Disorder of arteries and arterioles, unspecified: Secondary | ICD-10-CM | POA: Insufficient documentation

## 2021-09-22 DIAGNOSIS — E1121 Type 2 diabetes mellitus with diabetic nephropathy: Secondary | ICD-10-CM | POA: Insufficient documentation

## 2021-09-22 DIAGNOSIS — I700 Atherosclerosis of aorta: Secondary | ICD-10-CM | POA: Insufficient documentation

## 2021-09-22 DIAGNOSIS — D689 Coagulation defect, unspecified: Secondary | ICD-10-CM

## 2021-09-25 NOTE — Progress Notes (Signed)
?  Subjective:  ?Patient ID: Jesse James, male    DOB: 04/24/1941,  MRN: [id number] ?HPI ?Chief Complaint  ?Patient presents with  ? Diabetes  ?  Diabetic foot exam - requesting nail trim - last a1c was 7.2, blood thinner  ? New Patient (Initial Visit)  ? ? ?81 y.o. male presents with the above complaint.  ? ?ROS: Denies fever chills nausea vomiting muscle aches pains calf pain back pain chest pain shortness of breath. ? ?Past Medical History:  ?Diagnosis Date  ? Bradycardia   ? Chronic kidney disease (CKD), stage III (moderate) (HCC)   ? GERD (gastroesophageal reflux disease)   ? Hyperlipidemia   ? Hypertension   ? Peripheral vascular disease ([place])   ? TIA (transient ischemic attack)   ? Type 2 diabetes mellitus ([place])   ? ?Past Surgical History:  ?Procedure Laterality Date  ? APPENDECTOMY    ? tonslectomy    ? ? ?Current Outpatient Medications:  ?  ALBUTEROL IN, Inhale 2 puffs into the lungs every 4 (four) hours as needed., Disp: , Rfl:  ?  aspirin EC 81 MG tablet, Take 1 tablet (81 mg total) by mouth daily. Swallow whole., Disp: 90 tablet, Rfl: 3 ?  chlorthalidone (HYGROTON) 25 MG tablet, Take 1 tablet by mouth every morning., Disp: , Rfl:  ?  clopidogrel (PLAVIX) 75 MG tablet, Take 1 tablet (75 mg total) by mouth daily., Disp: 90 tablet, Rfl: 3 ?  metFORMIN (GLUCOPHAGE-XR) 500 MG 24 hr tablet, TAKE 2 TABLETS ONE TIME DAILY WITH FOOD, Disp: , Rfl:  ?  pantoprazole (PROTONIX) 20 MG tablet, Take 1 tablet by mouth every morning., Disp: , Rfl:  ?  propafenone (RYTHMOL SR) 225 MG 12 hr capsule, Take 1 capsule (225 mg total) by mouth 2 (two) times daily., Disp: 180 capsule, Rfl: 1 ?  simvastatin (ZOCOR) 80 MG tablet, 1 tablet, Disp: , Rfl:  ? ?No Known Allergies ?Review of Systems ?Objective:  ?There were no vitals filed for this visit. ? ?General: Well developed, nourished, in no acute distress, alert and oriented x3  ? ?Dermatological: Skin is warm, dry and supple bilateral. Nails x 10 are well maintained;  remaining integument appears unremarkable at this time. There are no open sores, no preulcerative lesions, no rash or signs of infection present. ? ?Vascular: Dorsalis Pedis artery and Posterior Tibial artery pedal pulses are 2/4 bilateral with immedate capillary fill time. Pedal hair growth present. No varicosities and no lower extremity edema present bilateral.  ? ?Neruologic: Grossly intact via light touch bilateral. Vibratory intact via tuning fork bilateral. Protective threshold with Semmes Wienstein monofilament intact to all pedal sites bilateral. Patellar and Achilles deep tendon reflexes 2+ bilateral. No Babinski or clonus noted bilateral.  ? ?Musculoskeletal: No gross boney pedal deformities bilateral. No pain, crepitus, or limitation noted with foot and ankle range of motion bilateral. Muscular strength 5/5 in all groups tested bilateral. ? ?Gait: Unassisted, Nonantalgic.  ? ? ?Radiographs: ? ?None taken ? ?Assessment & Plan:  ? ?Assessment: Diabetes pain limb secondary to onychomycosis he is also taking blood thinner. ? ?Plan: Debridement of toenails 1 through 5 bilateral. ? ? ? ? ?Isaias Dowson T. Iliff, DPM ?

## 2021-10-12 NOTE — Progress Notes (Unsigned)
Cardiology Office Note Date:  10/13/2021  Patient ID:  Jesse James, Jesse James 05-May-1941, MRN [id number] PCP:  Seward Carol, MD  Electrophysiologist: Dr. Curt Bears    Chief Complaint:  6 mo  History of Present Illness: Jesse James is a 81 y.o. male with history of HTN, HLD, TIA, CKD (III), PVCs, DM, varicose veins  CAD and PVD are mentioned in places of his chart, he denies both.  He has never had a MI, cardiac procedures, or stents.  He has had varicose vein stripping/procedure, never andy PAD/interventions.  He comes in today to be seen for Dr. Curt Bears, last seen by him Dec 2022.  Initially started on flecainide for his PVCs though switched to propafenone, his PVC burden down from 20% > 7% No changes were made.  Phone notes/my chart messages March/April pt c/o fatigue, observations of HRs 40 and O2 sats dipping to 80's with exertion, planned for a 7 day monitor Monitor noted continued improvement in his PVC burden to 4% and Dr. Curt Bears did not think the propafenone would cause his symptoms/observations and recommended he see his PMD.  Though consideration for an alternative AAD strategy could be considered if needed  Monitor noted PVCs down to 2.3%, no discussion on AV block or any significant bradycardia  Seems a reduction in his dose to '225mg'$  was decided on after further messaging  TODAY He is accompanied by his wife He feels better on the lowered propafenone dose.  Mostly bothered by constipation that he thinks is his medicines, but managed well with prunes and fiber. His HR and O2 sats are back to normal and he denies any ongoing symptoms. No CP, palpitations or cardiac awareness He denies SOB, has a chronic cough, still smokes and vapes.   He smokes a few cigarettes a day, but does use the vape quite a bit, his cough is worse with the vape Follows with pulm, not quite ready to give up either NO dizzy spells, near syncope or syncope.  AAD Hx Flecainide started May 2022  > stopped Sept 2022 with reports of blunted HR/fatigue with exercise Propafenone Sept 2022   Past Medical History:  Diagnosis Date   Bradycardia    Chronic kidney disease (CKD), stage III (moderate) (HCC)    GERD (gastroesophageal reflux disease)    Hyperlipidemia    Hypertension    Peripheral vascular disease (HCC)    TIA (transient ischemic attack)    Type 2 diabetes mellitus ([place])     Past Surgical History:  Procedure Laterality Date   APPENDECTOMY     tonslectomy      Current Outpatient Medications  Medication Sig Dispense Refill   ALBUTEROL IN Inhale 2 puffs into the lungs every 4 (four) hours as needed.     aspirin EC 81 MG tablet Take 1 tablet (81 mg total) by mouth daily. Swallow whole. 90 tablet 3   chlorthalidone (HYGROTON) 25 MG tablet Take 1 tablet by mouth every morning.     clopidogrel (PLAVIX) 75 MG tablet Take 1 tablet (75 mg total) by mouth daily. 90 tablet 3   metFORMIN (GLUCOPHAGE-XR) 500 MG 24 hr tablet TAKE 2 TABLETS ONE TIME DAILY WITH FOOD     pantoprazole (PROTONIX) 20 MG tablet Take 1 tablet by mouth every morning.     propafenone (RYTHMOL SR) 225 MG 12 hr capsule Take 1 capsule (225 mg total) by mouth 2 (two) times daily. 180 capsule 1   simvastatin (ZOCOR) 80 MG tablet 1 tablet  No current facility-administered medications for this visit.    Allergies:   Patient has no known allergies.   Social History:  The patient  reports that he has been smoking cigarettes. He has a 60.00 pack-year smoking history. He has never used smokeless tobacco. He reports that he does not drink alcohol and does not use drugs.   Family History:  The patient's family history includes Diabetes in his sister; Diabetes type II in his father; Heart attack (age of onset: 61) in his father; Heart attack (age of onset: 61) in his brother; Heart disease in his mother.  ROS:  Please see the history of present illness.    All other systems are reviewed and otherwise negative.    PHYSICAL EXAM:  VS:  BP (!) 132/58   Pulse 64   Ht '5\' 8"'$  (1.727 m)   Wt 206 lb (93.4 kg)   SpO2 96%   BMI 31.32 kg/m  BMI: Body mass index is 31.32 kg/m. Well nourished, well developed, in no acute distress HEENT: normocephalic, atraumatic Neck: no JVD, carotid bruits or masses Cardiac:   RRR; no extrasystoles heard during auscultation, no significant murmurs, no rubs, or gallops Lungs:  CTA b/l, no wheezing, rhonchi or rales Abd: soft, nontender MS: no deformity or atrophy Ext: no edema, large varicose veins L>R Skin: warm and dry, no rash Neuro:  No gross deficits appreciated Psych: euthymic mood, full affect    EKG:  Done today and reviewed by myself shows  SB (sinus rate is 50), 2 PVCs, PR 132mm QRS 885m QTc 44741mApril 2023: monitor Predominant rhythm was sinus rhythm 4.4% supraventricular ectopy 2.3% ventricular ectopy No atrial fibrillation noted Triggered episodes associated with both sinus rhythm and sinus rhythm with PVCs and PACs   TTE 10/04/20  Review of the above records today demonstrates:   1. Left ventricular ejection fraction, by estimation, is 60 to 65%. The  left ventricle has normal function. The left ventricle has no regional  wall motion abnormalities. Left ventricular diastolic parameters were  normal.   2. Right ventricular systolic function is normal. The right ventricular  size is normal. There is normal pulmonary artery systolic pressure.   3. Left atrial size was mildly dilated.   4. The mitral valve is normal in structure. Trivial mitral valve  regurgitation. No evidence of mitral stenosis.   5. The aortic valve is tricuspid. There is mild calcification of the  aortic valve. Aortic valve regurgitation is not visualized. Mild aortic  valve sclerosis is present, with no evidence of aortic valve stenosis.   6. The inferior vena cava is normal in size with greater than 50%  respiratory variability, suggesting right atrial pressure of 3  mmHg.    Cardiac monitor 03/11/2021 <1% supraventricular ectopy 7.1% ventricular ectopy Predominant rhythm was sinus rhythm Shortness of breath associated with sinus rhythm  10/25/20: stress myoview The left ventricular ejection fraction is normal (55-65%). Nuclear stress EF: 58%. Baseline EKG showed sinus bradycardia at 57bpm with LVH by voltage and repolarization abnormality. There was less than 1mm59m Horiztonal ST segment depression in the inferolateral walls with infusion and increased PVCs There is a small defect of mild severity present in the apical septal location. The defect is non-reversible. This is consistent with attenuation artifact. No ischemia noted. This is a low risk study.  Recent Labs: No results found for requested labs within last 8760 hours.  10/25/2020: Chol/HDL Ratio 2.3; Cholesterol, Total 108; HDL 46; LDL Chol Calc (NIH)  48; Triglycerides 62   CrCl cannot be calculated (Patient's most recent lab result is older than the maximum 21 days allowed.).   Wt Readings from Last 3 Encounters:  10/13/21 206 lb (93.4 kg)  04/19/21 207 lb 9.6 oz (94.2 kg)  01/13/21 205 lb (93 kg)     Other studies reviewed: Additional studies/records reviewed today include: summarized above  ASSESSMENT AND PLAN:  PVCs Asymptomatic He feels better on the lower dose of propafenone, intervals are stable In discussion today with the patient wife, they have a friend who developed amio pulm tox and amiodarone is not on the table for them  EKG and case reviewed with Dr. Curt Bears, continue same for now  HTN No changes today  Disposition: F/u with [place] in 49mo sooner if needed  Current medicines are reviewed at length with the patient today.  The patient did not have any concerns regarding medicines.  SVenetia Night PA-C 10/13/2021 12:54 PM     [place] [phone](office)  [phone](fax)

## 2021-10-13 ENCOUNTER — Encounter: Payer: Self-pay | Admitting: Physician Assistant

## 2021-10-13 ENCOUNTER — Ambulatory Visit: Payer: Medicare HMO | Admitting: Physician Assistant

## 2021-10-13 VITALS — BP 132/58 | HR 64 | Ht 68.0 in | Wt 206.0 lb

## 2021-10-13 DIAGNOSIS — I10 Essential (primary) hypertension: Secondary | ICD-10-CM | POA: Diagnosis not present

## 2021-10-13 DIAGNOSIS — I493 Ventricular premature depolarization: Secondary | ICD-10-CM

## 2021-10-13 NOTE — Patient Instructions (Signed)
Medication Instructions:   Your physician recommends that you continue on your current medications as directed. Please refer to the Current Medication list given to you today.  *If you need a refill on your cardiac medications before your next appointment, please call your pharmacy*   Lab Work: [place]   If you have labs (blood work) drawn today and your tests are completely normal, you will receive your results only by: [place] (if you have MyChart) OR A paper copy in the mail If you have any lab test that is abnormal or we need to change your treatment, we will call you to review the results.   Testing/Procedures: NONE ORDERED  TODAY     Follow-Up: At [facility], you and your health needs are our priority.  As part of our continuing mission to provide you with exceptional heart care, we have created designated Provider Care Teams.  These Care Teams include your primary Cardiologist (physician) and Advanced Practice Providers (APPs -  Physician Assistants and Nurse Practitioners) who all work together to provide you with the care you need, when you need it.  We recommend signing up for the patient portal called "MyChart".  Sign up information is provided on this After Visit Summary.  MyChart is used to connect with patients for Virtual Visits (Telemedicine).  Patients are able to view lab/test results, encounter notes, upcoming appointments, etc.  Non-urgent messages can be sent to your provider as well.   To learn more about what you can do with MyChart, go to [email or web address].    Your next appointment:   3 month(s)   The format for your next appointment:   In Person  Provider:   You may see Will Meredith Leeds, MD or one of the following Advanced Practice Providers on your designated Care Team:   Tommye Standard, [place] Legrand Como "Jonni Sanger" Chalmers Cater, PA-C{  Other Instructions   Important Information About Sugar

## 2021-10-31 DIAGNOSIS — I10 Essential (primary) hypertension: Secondary | ICD-10-CM | POA: Diagnosis not present

## 2021-10-31 DIAGNOSIS — E782 Mixed hyperlipidemia: Secondary | ICD-10-CM | POA: Diagnosis not present

## 2021-10-31 DIAGNOSIS — E1122 Type 2 diabetes mellitus with diabetic chronic kidney disease: Secondary | ICD-10-CM | POA: Diagnosis not present

## 2021-10-31 DIAGNOSIS — N400 Enlarged prostate without lower urinary tract symptoms: Secondary | ICD-10-CM | POA: Diagnosis not present

## 2021-10-31 DIAGNOSIS — N183 Chronic kidney disease, stage 3 unspecified: Secondary | ICD-10-CM | POA: Diagnosis not present

## 2021-11-23 DIAGNOSIS — E119 Type 2 diabetes mellitus without complications: Secondary | ICD-10-CM | POA: Diagnosis not present

## 2021-11-23 DIAGNOSIS — H02831 Dermatochalasis of right upper eyelid: Secondary | ICD-10-CM | POA: Diagnosis not present

## 2021-11-23 DIAGNOSIS — Z961 Presence of intraocular lens: Secondary | ICD-10-CM | POA: Diagnosis not present

## 2021-11-23 DIAGNOSIS — H43811 Vitreous degeneration, right eye: Secondary | ICD-10-CM | POA: Diagnosis not present

## 2021-11-23 DIAGNOSIS — H5010 Unspecified exotropia: Secondary | ICD-10-CM | POA: Diagnosis not present

## 2021-11-23 DIAGNOSIS — H04123 Dry eye syndrome of bilateral lacrimal glands: Secondary | ICD-10-CM | POA: Diagnosis not present

## 2021-11-23 DIAGNOSIS — H02834 Dermatochalasis of left upper eyelid: Secondary | ICD-10-CM | POA: Diagnosis not present

## 2021-12-14 DIAGNOSIS — K2270 Barrett's esophagus without dysplasia: Secondary | ICD-10-CM | POA: Diagnosis not present

## 2021-12-26 DIAGNOSIS — I10 Essential (primary) hypertension: Secondary | ICD-10-CM | POA: Diagnosis not present

## 2022-01-12 ENCOUNTER — Ambulatory Visit: Payer: Medicare HMO | Admitting: Podiatry

## 2022-01-12 ENCOUNTER — Encounter: Payer: Self-pay | Admitting: Podiatry

## 2022-01-12 DIAGNOSIS — B351 Tinea unguium: Secondary | ICD-10-CM

## 2022-01-12 DIAGNOSIS — D689 Coagulation defect, unspecified: Secondary | ICD-10-CM

## 2022-01-12 DIAGNOSIS — M79676 Pain in unspecified toe(s): Secondary | ICD-10-CM

## 2022-01-12 NOTE — Progress Notes (Signed)
He presents today chief complaint of painful elongated toenails 1 through 5 bilateral.  Objective: Nails are long thick yellow dystrophic with mycotic pulses are palpable.  No open lesions or wounds.  Assessment: Pain in limb secondary to onychomycosis.  Plan: Debridement of toenails 1 through 5 bilateral.

## 2022-01-18 ENCOUNTER — Ambulatory Visit: Payer: Medicare HMO | Attending: Cardiology | Admitting: Cardiology

## 2022-01-18 ENCOUNTER — Encounter: Payer: Self-pay | Admitting: Cardiology

## 2022-01-18 VITALS — BP 124/68 | HR 56 | Ht 68.0 in | Wt 204.4 lb

## 2022-01-18 DIAGNOSIS — I493 Ventricular premature depolarization: Secondary | ICD-10-CM | POA: Diagnosis not present

## 2022-01-18 MED ORDER — PROPAFENONE HCL ER 225 MG PO CP12
225.0000 mg | ORAL_CAPSULE | Freq: Two times a day (BID) | ORAL | 3 refills | Status: DC
Start: 2022-01-18 — End: 2023-01-25

## 2022-01-18 NOTE — Progress Notes (Signed)
Electrophysiology Office Note   Date:  01/18/2022   ID:  Jesse James, DOB [birth year], MRN [id number]  PCP:  Jesse Carol, MD  Cardiologist:   Primary Electrophysiologist:  Jesse Mellone Meredith Leeds, MD    Chief Complaint: PVC   History of Present Illness: Jesse James is a 81 y.o. male who is being seen today for the evaluation of PVC at the request of Jesse Carol, MD. Presenting today for electrophysiology evaluation.  He has a history significant for tobacco abuse, TIA, coronary artery disease, GERD, type 2 diabetes, hypertension, hyperlipidemia, PAD, CKD stage III, bradycardia.  He went to the hospital 09/08/2020 with slow heart rates.  He was found to have elevated PVCs.  He wore a monitor that showed a 20% burden.  He was started on flecainide but has been switched to propafenone.  His burden is down to 7% on most recent monitor.  Today, denies symptoms of palpitations, chest pain, shortness of breath, orthopnea, PND, lower extremity edema, claudication, dizziness, presyncope, syncope, bleeding, or neurologic sequela. The patient is tolerating medications without difficulties.  Since being seen he has done well.  He has noted minimal PVCs.  He is overall quite happy with his control.  His main complaint today is constipation.  He is taking fiber and eating many prunes.  He is concerned that the propafenone is causing him to be constipated.    Past Medical History:  Diagnosis Date   Bradycardia    Chronic kidney disease (CKD), stage III (moderate) (HCC)    GERD (gastroesophageal reflux disease)    Hyperlipidemia    Hypertension    Peripheral vascular disease (HCC)    TIA (transient ischemic attack)    Type 2 diabetes mellitus (HCC)    Past Surgical History:  Procedure Laterality Date   APPENDECTOMY     tonslectomy       Current Outpatient Medications  Medication Sig Dispense Refill   ALBUTEROL IN Inhale 2 puffs into the lungs every 4 (four) hours as needed.      aspirin EC 81 MG tablet Take 1 tablet (81 mg total) by mouth daily. Swallow whole. 90 tablet 3   chlorthalidone (HYGROTON) 25 MG tablet Take 1 tablet by mouth every morning.     clopidogrel (PLAVIX) 75 MG tablet Take 1 tablet (75 mg total) by mouth daily. 90 tablet 3   losartan (COZAAR) 25 MG tablet Take 25 mg by mouth daily.     metFORMIN (GLUCOPHAGE-XR) 500 MG 24 hr tablet TAKE 2 TABLETS ONE TIME DAILY WITH FOOD     pantoprazole (PROTONIX) 20 MG tablet Take 1 tablet by mouth every morning.     simvastatin (ZOCOR) 80 MG tablet 1 tablet     propafenone (RYTHMOL SR) 225 MG 12 hr capsule Take 1 capsule (225 mg total) by mouth 2 (two) times daily. 180 capsule 3   No current facility-administered medications for this visit.    Allergies:   Patient has no known allergies.   Social History:  The patient  reports that he has been smoking cigarettes. He has a 60.00 pack-year smoking history. He has never used smokeless tobacco. He reports that he does not drink alcohol and does not use drugs.   Family History:  The patient's family history includes Diabetes in his sister; Diabetes type II in his father; Heart attack (age of onset: 31) in his father; Heart attack (age of onset: 11) in his brother; Heart disease in his mother.   ROS:  Please  see the history of present illness.   Otherwise, review of systems is positive for none.   All other systems are reviewed and negative.   PHYSICAL EXAM: VS:  BP 124/68   Pulse (!) 56   Ht '5\' 8"'$  (1.727 m)   Wt 204 lb 6.4 oz (92.7 kg)   SpO2 94%   BMI 31.08 kg/m  , BMI Body mass index is 31.08 kg/m. GEN: Well nourished, well developed, in no acute distress  HEENT: normal  Neck: no JVD, carotid bruits, or masses Cardiac: RRR; no murmurs, rubs, or gallops,no edema  Respiratory:  clear to auscultation bilaterally, normal work of breathing GI: soft, nontender, nondistended, + BS MS: no deformity or atrophy  Skin: warm and dry Neuro:  Strength and sensation  are intact Psych: euthymic mood, full affect  EKG:  EKG is ordered today. Personal review of the ekg ordered shows sinus rhythm, rate 56, PVC   Recent Labs: No results found for requested labs within last 365 days.    Lipid Panel     Component Value Date/Time   CHOL 108 10/25/2020 0736   TRIG 62 10/25/2020 0736   HDL 46 10/25/2020 0736   CHOLHDL 2.3 10/25/2020 0736   LDLCALC 48 10/25/2020 0736     Wt Readings from Last 3 Encounters:  01/18/22 204 lb 6.4 oz (92.7 kg)  10/13/21 206 lb (93.4 kg)  04/19/21 207 lb 9.6 oz (94.2 kg)      Other studies Reviewed: Additional studies/ records that were reviewed today include: TTE 10/04/20  Review of the above records today demonstrates:   1. Left ventricular ejection fraction, by estimation, is 60 to 65%. The  left ventricle has normal function. The left ventricle has no regional  wall motion abnormalities. Left ventricular diastolic parameters were  normal.   2. Right ventricular systolic function is normal. The right ventricular  size is normal. There is normal pulmonary artery systolic pressure.   3. Left atrial size was mildly dilated.   4. The mitral valve is normal in structure. Trivial mitral valve  regurgitation. No evidence of mitral stenosis.   5. The aortic valve is tricuspid. There is mild calcification of the  aortic valve. Aortic valve regurgitation is not visualized. Mild aortic  valve sclerosis is present, with no evidence of aortic valve stenosis.   6. The inferior vena cava is normal in size with greater than 50%  respiratory variability, suggesting right atrial pressure of 3 mmHg.   Cardiac monitor 03/11/2021 personally reviewed <1% supraventricular ectopy 7.1% ventricular ectopy Predominant rhythm was sinus rhythm Shortness of breath associated with sinus rhythm  ASSESSMENT AND PLAN:  1.  PVCs: 20% burden.  Currently on propafenone 325 mg twice daily.  High risk medication monitoring via ECG for  propafenone.  Repeat monitor shows a 7% burden.  Happy with his control.  He is having some constipation.  He feels that it may be due to his Rythmol.  He [name] stop it for the next week.  If constipation improves he [name] let us know and we Jaquala Fuller potentially switch to a different antiarrhythmic.  2.  Hypertension: Currently well controlled  3.  Hyperlipidemia: Continue simvastatin per primary physician  4.  Constipation: He Jahaziel Francois hold his propafenone for 1 week to see if this improves his constipation.  If so, may need to switch to an alternative antiarrhythmic.  Current medicines are reviewed at length with the patient today.   The patient does not have concerns regarding his  medicines.  The following changes were made today: None  Labs/ tests ordered today include:  Orders Placed This Encounter  Procedures   EKG 12-Lead       Disposition:   FU 6 months  Signed, Virgia Kelner Meredith Leeds, MD  01/18/2022 3:24 PM     [place] [address] [phone] (office) [phone] (fax)

## 2022-01-18 NOTE — Patient Instructions (Signed)
Medication Instructions:  Your physician recommends that you continue on your current medications as directed. Please refer to the Current Medication list given to you today.  *If you need a refill on your cardiac medications before your next appointment, please call your pharmacy*   Lab Work: None ordered  Testing/Procedures: None ordered   Follow-Up: At [facility], you and your health needs are our priority.  As part of our continuing mission to provide you with exceptional heart care, we have created designated Provider Care Teams.  These Care Teams include your primary Cardiologist (physician) and Advanced Practice Providers (APPs -  Physician Assistants and Nurse Practitioners) who all work together to provide you with the care you need, when you need it.   Your next appointment:   6 month(s)  The format for your next appointment:   In Person  Provider:   You will see one of the following Advanced Practice Providers on your designated Care Team:   Renee Ursuy, PA-C Michael "Andy" Tillery, PA-C    Thank you for choosing [facility]!!   Kimmie Doren, RN [phone]  Other Instructions   Important Information About Sugar           

## 2022-02-01 DIAGNOSIS — Z23 Encounter for immunization: Secondary | ICD-10-CM | POA: Diagnosis not present

## 2022-02-17 DIAGNOSIS — Z03818 Encounter for observation for suspected exposure to other biological agents ruled out: Secondary | ICD-10-CM | POA: Diagnosis not present

## 2022-02-17 DIAGNOSIS — J069 Acute upper respiratory infection, unspecified: Secondary | ICD-10-CM | POA: Diagnosis not present

## 2022-03-02 DIAGNOSIS — H9209 Otalgia, unspecified ear: Secondary | ICD-10-CM | POA: Diagnosis not present

## 2022-03-02 DIAGNOSIS — I700 Atherosclerosis of aorta: Secondary | ICD-10-CM | POA: Diagnosis not present

## 2022-03-02 DIAGNOSIS — I739 Peripheral vascular disease, unspecified: Secondary | ICD-10-CM | POA: Diagnosis not present

## 2022-03-02 DIAGNOSIS — E1151 Type 2 diabetes mellitus with diabetic peripheral angiopathy without gangrene: Secondary | ICD-10-CM | POA: Diagnosis not present

## 2022-03-02 DIAGNOSIS — E7800 Pure hypercholesterolemia, unspecified: Secondary | ICD-10-CM | POA: Diagnosis not present

## 2022-03-02 DIAGNOSIS — I10 Essential (primary) hypertension: Secondary | ICD-10-CM | POA: Diagnosis not present

## 2022-03-02 DIAGNOSIS — F17210 Nicotine dependence, cigarettes, uncomplicated: Secondary | ICD-10-CM | POA: Diagnosis not present

## 2022-03-02 DIAGNOSIS — J449 Chronic obstructive pulmonary disease, unspecified: Secondary | ICD-10-CM | POA: Diagnosis not present

## 2022-04-05 DIAGNOSIS — E1151 Type 2 diabetes mellitus with diabetic peripheral angiopathy without gangrene: Secondary | ICD-10-CM | POA: Diagnosis not present

## 2022-04-05 DIAGNOSIS — N400 Enlarged prostate without lower urinary tract symptoms: Secondary | ICD-10-CM | POA: Diagnosis not present

## 2022-04-05 DIAGNOSIS — J439 Emphysema, unspecified: Secondary | ICD-10-CM | POA: Diagnosis not present

## 2022-04-05 DIAGNOSIS — I10 Essential (primary) hypertension: Secondary | ICD-10-CM | POA: Diagnosis not present

## 2022-04-05 DIAGNOSIS — E782 Mixed hyperlipidemia: Secondary | ICD-10-CM | POA: Diagnosis not present

## 2022-04-18 ENCOUNTER — Encounter: Payer: Self-pay | Admitting: Podiatry

## 2022-04-18 ENCOUNTER — Ambulatory Visit: Payer: Medicare HMO | Admitting: Podiatry

## 2022-04-18 DIAGNOSIS — B351 Tinea unguium: Secondary | ICD-10-CM

## 2022-04-18 DIAGNOSIS — M79676 Pain in unspecified toe(s): Secondary | ICD-10-CM

## 2022-04-18 DIAGNOSIS — D689 Coagulation defect, unspecified: Secondary | ICD-10-CM | POA: Diagnosis not present

## 2022-04-18 NOTE — Progress Notes (Signed)
He presents today chief complaint of painful elongated toenails.  Objective: Toenails are long thick yellow dystrophic-like mycotic pulses are palpable.  Assessment: Pain in limb secondary to onychomycosis.  Plan: Debridement of toenails 1 through 5 bilateral.

## 2022-04-28 ENCOUNTER — Other Ambulatory Visit: Payer: Self-pay | Admitting: Cardiology

## 2022-05-01 DIAGNOSIS — H903 Sensorineural hearing loss, bilateral: Secondary | ICD-10-CM | POA: Diagnosis not present

## 2022-07-11 DIAGNOSIS — I493 Ventricular premature depolarization: Secondary | ICD-10-CM | POA: Insufficient documentation

## 2022-07-11 NOTE — Progress Notes (Signed)
   PCP:  Seward Carol, MD Primary Cardiologist: None Electrophysiologist: Will Meredith Leeds, MD   Jesse James is a 82 y.o. male with h/o TIA, DM2, HLD, CKDIII, and HTN seen today for Will Meredith Leeds, MD for routine electrophysiology followup. Since last being seen in our clinic the patient reports doing very well. Recently moved and was tired doing that, but otherwise getting around and doing everything he needs to.  he denies chest pain, palpitations, dyspnea, PND, orthopnea, nausea, vomiting, dizziness, syncope, edema, weight gain, or early satiety.   Current Outpatient Medications  Medication Instructions   ALBUTEROL IN 2 puffs, Inhalation, Every 4 hours PRN   aspirin EC 81 mg, Oral, Daily, Swallow whole.   chlorthalidone (HYGROTON) 25 MG tablet 1 tablet, Oral, Every morning   clopidogrel (PLAVIX) 75 mg, Oral, Daily   losartan (COZAAR) 25 mg, Oral, Daily   metFORMIN (GLUCOPHAGE-XR) 500 MG 24 hr tablet TAKE 2 TABLETS ONE TIME DAILY WITH FOOD   pantoprazole (PROTONIX) 20 MG tablet 1 tablet, Oral, Every morning   propafenone (RYTHMOL SR) 225 mg, Oral, 2 times daily   simvastatin (ZOCOR) 80 MG tablet 1 tablet    Physical Exam: Vitals:   07/17/22 0941  BP: (!) 138/56  Pulse: (!) 55  SpO2: 98%  Weight: 195 lb 3.2 oz (88.5 kg)  Height: '5\' 8"'$  (1.727 m)    GEN- NAD. A&O x 3. Normal affect. HEENT: normocephalic, atraumatic Lungs- CTAB, Normal effort Heart- Regular rate and rhythm, No M/G/R Extremities- No peripheral edema. no clubbing or cyanosis; Skin- warm and dry, no rash or lesion  EKG is ordered today. Personal review shows sinus bradycardia at 57 bpm, stable intervals  Additional studies reviewed include: Previous EP notes.   Assessment and Plan: Hypertension Currently stable on losartan  PVC's (premature ventricular contractions) EKG today as above.  Continue propafenone 225 mg BID, previously reduced due to constipation We discussed his medication is  significantly cheaper vie GoodRx (50-70 for 3 months as opposed to the 200 he is currently paying)  Follow up with Dr. Curt Bears in 6 months  Shirley Friar, PA-C  07/17/22 9:50 AM

## 2022-07-11 NOTE — Assessment & Plan Note (Signed)
EKG today as above.  Continue propafenone 225 mg BID, previously reduced due to constipation

## 2022-07-11 NOTE — Assessment & Plan Note (Signed)
Currently stable on losartan

## 2022-07-17 ENCOUNTER — Ambulatory Visit: Payer: Medicare HMO | Attending: Student | Admitting: Student

## 2022-07-17 ENCOUNTER — Encounter: Payer: Self-pay | Admitting: Student

## 2022-07-17 VITALS — BP 138/56 | HR 55 | Ht 68.0 in | Wt 195.2 lb

## 2022-07-17 DIAGNOSIS — I493 Ventricular premature depolarization: Secondary | ICD-10-CM | POA: Diagnosis not present

## 2022-07-17 DIAGNOSIS — I10 Essential (primary) hypertension: Secondary | ICD-10-CM | POA: Diagnosis not present

## 2022-07-17 LAB — BASIC METABOLIC PANEL
BUN/Creatinine Ratio: 14 (ref 10–24)
BUN: 15 mg/dL (ref 8–27)
CO2: 24 mmol/L (ref 20–29)
Calcium: 9.2 mg/dL (ref 8.6–10.2)
Chloride: 94 mmol/L — ABNORMAL LOW (ref 96–106)
Creatinine, Ser: 1.07 mg/dL (ref 0.76–1.27)
Glucose: 185 mg/dL — ABNORMAL HIGH (ref 70–99)
Potassium: 3.7 mmol/L (ref 3.5–5.2)
Sodium: 131 mmol/L — ABNORMAL LOW (ref 134–144)
eGFR: 69 mL/min/{1.73_m2} (ref >59)

## 2022-07-17 LAB — MAGNESIUM: Magnesium: 1.9 mg/dL (ref 1.6–2.3)

## 2022-07-17 NOTE — Patient Instructions (Signed)
Medication Instructions:  Your physician recommends that you continue on your current medications as directed. Please refer to the Current Medication list given to you today.  *If you need a refill on your cardiac medications before your next appointment, please call your pharmacy*   Lab Work: BMET and Mag today If you have labs (blood work) drawn today and your tests are completely normal, you will receive your results only by: [place] (if you have MyChart) OR A paper copy in the mail If you have any lab test that is abnormal or we need to change your treatment, we will call you to review the results.   Follow-Up: At [facility], you and your health needs are our priority.  As part of our continuing mission to provide you with exceptional heart care, we have created designated Provider Care Teams.  These Care Teams include your primary Cardiologist (physician) and Advanced Practice Providers (APPs -  Physician Assistants and Nurse Practitioners) who all work together to provide you with the care you need, when you need it.   Your next appointment:   6 month(s)  Provider:   Dr Curt Bears

## 2022-07-21 ENCOUNTER — Other Ambulatory Visit: Payer: Self-pay | Admitting: *Deleted

## 2022-07-21 DIAGNOSIS — Z79899 Other long term (current) drug therapy: Secondary | ICD-10-CM

## 2022-07-21 MED ORDER — POTASSIUM CHLORIDE CRYS ER 20 MEQ PO TBCR: 20.0000 meq | EXTENDED_RELEASE_TABLET | Freq: Every day | ORAL | 3 refills | Status: AC

## 2022-07-23 ENCOUNTER — Encounter: Payer: Self-pay | Admitting: Cardiology

## 2022-07-27 ENCOUNTER — Encounter: Payer: Self-pay | Admitting: Podiatry

## 2022-07-27 ENCOUNTER — Ambulatory Visit: Payer: Medicare HMO | Admitting: Podiatry

## 2022-07-27 DIAGNOSIS — M79676 Pain in unspecified toe(s): Secondary | ICD-10-CM

## 2022-07-27 DIAGNOSIS — D689 Coagulation defect, unspecified: Secondary | ICD-10-CM

## 2022-07-27 DIAGNOSIS — B351 Tinea unguium: Secondary | ICD-10-CM

## 2022-07-27 NOTE — Progress Notes (Addendum)
He presents today chief complaint of painful elongated toenails and calluses.  Objective: Toenails are long thick yellow dystrophic onychomycotic calluses to the plantar medial aspect of first metatarsophalangeal joint of the right foot.  Assessment: Pain in limb secondary to onychomycosis.  Plan: Debridement of toenails 1 through 5 bilateral observe secondary to pain.  Debridement benign skin lesions.

## 2022-08-02 ENCOUNTER — Ambulatory Visit: Payer: Medicare HMO | Attending: Student

## 2022-08-02 DIAGNOSIS — Z79899 Other long term (current) drug therapy: Secondary | ICD-10-CM | POA: Diagnosis not present

## 2022-08-03 LAB — BASIC METABOLIC PANEL
BUN/Creatinine Ratio: 15 (ref 10–24)
BUN: 14 mg/dL (ref 8–27)
CO2: 27 mmol/L (ref 20–29)
Calcium: 9.5 mg/dL (ref 8.6–10.2)
Chloride: 92 mmol/L — ABNORMAL LOW (ref 96–106)
Creatinine, Ser: 0.95 mg/dL (ref 0.76–1.27)
Glucose: 99 mg/dL (ref 70–99)
Potassium: 3.9 mmol/L (ref 3.5–5.2)
Sodium: 131 mmol/L — ABNORMAL LOW (ref 134–144)
eGFR: 79 mL/min/{1.73_m2} (ref >59)

## 2022-08-28 ENCOUNTER — Other Ambulatory Visit: Payer: Self-pay | Admitting: Internal Medicine

## 2022-08-28 DIAGNOSIS — I700 Atherosclerosis of aorta: Secondary | ICD-10-CM | POA: Diagnosis not present

## 2022-08-28 DIAGNOSIS — I10 Essential (primary) hypertension: Secondary | ICD-10-CM | POA: Diagnosis not present

## 2022-08-28 DIAGNOSIS — E1151 Type 2 diabetes mellitus with diabetic peripheral angiopathy without gangrene: Secondary | ICD-10-CM | POA: Diagnosis not present

## 2022-08-28 DIAGNOSIS — I739 Peripheral vascular disease, unspecified: Secondary | ICD-10-CM

## 2022-08-28 DIAGNOSIS — E1122 Type 2 diabetes mellitus with diabetic chronic kidney disease: Secondary | ICD-10-CM | POA: Diagnosis not present

## 2022-08-28 DIAGNOSIS — E7800 Pure hypercholesterolemia, unspecified: Secondary | ICD-10-CM | POA: Diagnosis not present

## 2022-08-28 DIAGNOSIS — J439 Emphysema, unspecified: Secondary | ICD-10-CM | POA: Diagnosis not present

## 2022-10-25 ENCOUNTER — Ambulatory Visit
Admission: RE | Admit: 2022-10-25 | Discharge: 2022-10-25 | Disposition: A | Discharge status: Home / Self Care | Payer: Medicare HMO | Source: Ambulatory Visit | Attending: Internal Medicine | Admitting: Internal Medicine

## 2022-10-25 ENCOUNTER — Other Ambulatory Visit: Payer: Self-pay | Admitting: Internal Medicine

## 2022-10-25 DIAGNOSIS — I739 Peripheral vascular disease, unspecified: Secondary | ICD-10-CM | POA: Diagnosis not present

## 2022-10-26 ENCOUNTER — Encounter: Payer: Self-pay | Admitting: Podiatry

## 2022-10-26 ENCOUNTER — Ambulatory Visit: Payer: Medicare HMO | Admitting: Podiatry

## 2022-10-26 DIAGNOSIS — M79676 Pain in unspecified toe(s): Secondary | ICD-10-CM | POA: Diagnosis not present

## 2022-10-26 DIAGNOSIS — D689 Coagulation defect, unspecified: Secondary | ICD-10-CM | POA: Diagnosis not present

## 2022-10-26 DIAGNOSIS — B351 Tinea unguium: Secondary | ICD-10-CM

## 2022-10-26 NOTE — Progress Notes (Signed)
He presents today chief complaint of painful elongated toenails.  Objective: Pulses are palpable no open lesions or wounds toenails are thick yellow dystrophic onychomycotic.  Assessment: Pain in limb secondary to onychomycosis.  Plan: Debridement of toenails 1 through 5 bilateral.

## 2022-11-30 DIAGNOSIS — H5010 Unspecified exotropia: Secondary | ICD-10-CM | POA: Diagnosis not present

## 2022-11-30 DIAGNOSIS — E119 Type 2 diabetes mellitus without complications: Secondary | ICD-10-CM | POA: Diagnosis not present

## 2022-11-30 DIAGNOSIS — H43813 Vitreous degeneration, bilateral: Secondary | ICD-10-CM | POA: Diagnosis not present

## 2022-11-30 DIAGNOSIS — H04123 Dry eye syndrome of bilateral lacrimal glands: Secondary | ICD-10-CM | POA: Diagnosis not present

## 2022-11-30 DIAGNOSIS — H02831 Dermatochalasis of right upper eyelid: Secondary | ICD-10-CM | POA: Diagnosis not present

## 2022-11-30 DIAGNOSIS — H02834 Dermatochalasis of left upper eyelid: Secondary | ICD-10-CM | POA: Diagnosis not present

## 2023-01-25 ENCOUNTER — Telehealth: Payer: Self-pay | Admitting: Cardiology

## 2023-01-25 ENCOUNTER — Ambulatory Visit: Payer: Medicare HMO | Admitting: Podiatry

## 2023-01-25 DIAGNOSIS — M79676 Pain in unspecified toe(s): Secondary | ICD-10-CM | POA: Diagnosis not present

## 2023-01-25 DIAGNOSIS — B351 Tinea unguium: Secondary | ICD-10-CM | POA: Diagnosis not present

## 2023-01-25 MED ORDER — PROPAFENONE HCL ER 225 MG PO CP12
225.0000 mg | ORAL_CAPSULE | Freq: Two times a day (BID) | ORAL | 1 refills | Status: DC
Start: 2023-01-25 — End: 2023-06-21

## 2023-01-25 NOTE — Telephone Encounter (Signed)
Pt's medication was sent to pt's pharmacy as requested. Confirmation received.  °

## 2023-01-25 NOTE — Telephone Encounter (Signed)
*  STAT* If patient is at the pharmacy, call can be transferred to refill team.   1. Which medications need to be refilled? (please list name of each medication and dose if known)   propafenone (RYTHMOL SR) 225 MG 12 hr capsule     2. Would you like to learn more about the convenience, safety, & potential cost savings by using the [facility]? No   3. Are you open to using the [facility] (Type [facility] ) No   4. Which pharmacy/location (including street and city if local pharmacy) is medication to be sent to?[facility] - [place], [place] - [address]    5. Do they need a 30 day or 90 day supply? 90 day

## 2023-01-28 NOTE — Progress Notes (Signed)
Presents today chief complaint of painful elongated toenails.  Objective: Toenails are long thick yellow dystrophic onychomycotic pulses are palpable no open lesions or wounds.  Assessment: Pain in limb secondary to onychomycosis.  Plan: Debridement of toenails 1 through 5 bilateral.

## 2023-01-29 ENCOUNTER — Encounter: Payer: Self-pay | Admitting: Cardiology

## 2023-01-29 ENCOUNTER — Ambulatory Visit: Payer: Medicare HMO | Attending: Cardiology | Admitting: Cardiology

## 2023-01-29 VITALS — BP 124/70 | HR 45 | Ht 68.0 in | Wt 201.4 lb

## 2023-01-29 DIAGNOSIS — I493 Ventricular premature depolarization: Secondary | ICD-10-CM | POA: Diagnosis not present

## 2023-01-29 DIAGNOSIS — R001 Bradycardia, unspecified: Secondary | ICD-10-CM | POA: Diagnosis not present

## 2023-01-29 DIAGNOSIS — I10 Essential (primary) hypertension: Secondary | ICD-10-CM | POA: Diagnosis not present

## 2023-01-29 NOTE — Progress Notes (Signed)
Electrophysiology Office Note:   Date:  01/29/2023  ID:  Jesse James, DOB [birth year], MRN [id number]  Primary Cardiologist: None Electrophysiologist: Jesse Betton Jorja Loa, MD      History of Present Illness:   Jesse James is a 82 y.o. male with h/o PVCs, tobacco abuse, TIA, coronary artery disease, type 2 diabetes, hypertension, hyperlipidemia, PAD, CKD 3, bradycardia seen today for routine electrophysiology followup.   Since last being seen in our clinic the patient reports doing overall well.  He is not symptomatic from his PVCs.  He continues to be somewhat active.  He is just moved to [place] assisted living.  He is unaware of PVCs or palpitations.  he denies chest pain, palpitations, dyspnea, PND, orthopnea, nausea, vomiting, dizziness, syncope, edema, weight gain, or early satiety.   Review of systems complete and found to be negative unless listed in HPI.   EP Information / Studies Reviewed:    EKG is ordered today. Personal review as below.  EKG Interpretation Date/Time:  Monday January 29 2023 10:10:43 EDT Ventricular Rate:  46 PR Interval:  164 QRS Duration:  80 QT Interval:  454 QTC Calculation: 397 R Axis:   78  Text Interpretation: Sinus bradycardia Nonspecific T wave abnormality When compared with ECG of 25-Oct-2020 09:20, Premature ventricular complexes are no longer Present Confirmed by Jesse James ([id number]) on 01/29/2023 10:17:41 AM     Risk Assessment/Calculations:              Physical Exam:   VS:  BP 124/70 (BP Location: Left Arm, Patient Position: Sitting, Cuff Size: Large)   Pulse (!) 45   Ht 5\' 8"  (1.727 m)   Wt 201 lb 6.4 oz (91.4 kg)   SpO2 99%   BMI 30.62 kg/m    Wt Readings from Last 3 Encounters:  01/29/23 201 lb 6.4 oz (91.4 kg)  07/17/22 195 lb 3.2 oz (88.5 kg)  01/18/22 204 lb 6.4 oz (92.7 kg)     GEN: Well nourished, well developed in no acute distress NECK: No JVD; No carotid bruits CARDIAC: Regular rate and rhythm, no  murmurs, rubs, gallops RESPIRATORY:  Clear to auscultation without rales, wheezing or rhonchi  ABDOMEN: Soft, non-tender, non-distended EXTREMITIES:  No edema; No deformity   ASSESSMENT AND PLAN:    1.  PVCs: Previously had a 20% burden.  Now on propafenone.  Burden at 7%.  He currently feels well and is without complaint.  Jesse James continue with current management.  2.  Hypertension: Currently well-controlled  3.  Hyperlipidemia: Continue simvastatin per primary physician  Follow up with EP APP in 6 months  Signed, Jesse Wehrli Jorja Loa, MD

## 2023-02-01 ENCOUNTER — Telehealth: Payer: Self-pay | Admitting: *Deleted

## 2023-02-01 DIAGNOSIS — Z86010 Personal history of colonic polyps: Secondary | ICD-10-CM | POA: Diagnosis not present

## 2023-02-01 DIAGNOSIS — K2270 Barrett's esophagus without dysplasia: Secondary | ICD-10-CM | POA: Diagnosis not present

## 2023-02-01 NOTE — Telephone Encounter (Signed)
Dr. Elberta Fortis, You last saw Jesse James 01/29/23 for follow up. Pre-op team has received request for clearance for endoscopy with propofol. Can you weigh in on medical clearance for his procedure?   Thanks, Reather Littler, NP

## 2023-02-01 NOTE — Telephone Encounter (Signed)
Pre-operative Risk Assessment    Patient Name: Jesse James  DOB: 06/21/1940 MRN: [id number]    LAST OV: 01-29-23  NEXT OV: NONE  Request for Surgical Clearance    Procedure:   ENDOSCOPY   Date of Surgery:  Clearance TBD                                 Surgeon:  [facility] Surgeon's Group or Practice Name:  [facility] Phone number:  [phone] Fax number:  [phone]   Type of Clearance Requested:   - Pharmacy:  Hold Clopidogrel (Plavix) APPROPRIATE TIME TO HOLD    Type of Anesthesia:   PROPOFOL   Additional requests/questions:   N/A  Freddy Jaksch   02/01/2023, 2:46 PM

## 2023-02-02 NOTE — Telephone Encounter (Signed)
Name: Jesse James  DOB: 03-27-1941  MRN: [id number]   Primary Cardiologist: None  Chart reviewed as part of pre-operative protocol coverage. Jethro Bolus was last seen on 01/29/2023 by Dr. Elberta Fortis.  Per Dr. Elberta Fortis "Intermediate risk for intermediate risk procedure. No further cardiac testing needed."  Therefore, based on ACC/AHA guidelines, the patient would be at acceptable risk for the planned procedure without further cardiovascular testing.   Per office protocol, he may hold Plavix for 5 days prior to procedure and should resume as soon as hemodynamically stable postoperatively.   I will route this recommendation to the requesting party via Epic fax function and remove from pre-op pool. Please call with questions.  Carlos Levering, NP 02/02/2023, 8:55 AM

## 2023-02-09 IMAGING — CR DG CHEST 2V
2 series · 2 of 2 positions shown · non-contrast
Comparison: CT chest 10/23/2014

CLINICAL DATA: Shortness of breath.  Dyspnea.

EXAM:
CHEST - 2 VIEW

[w chest pa]
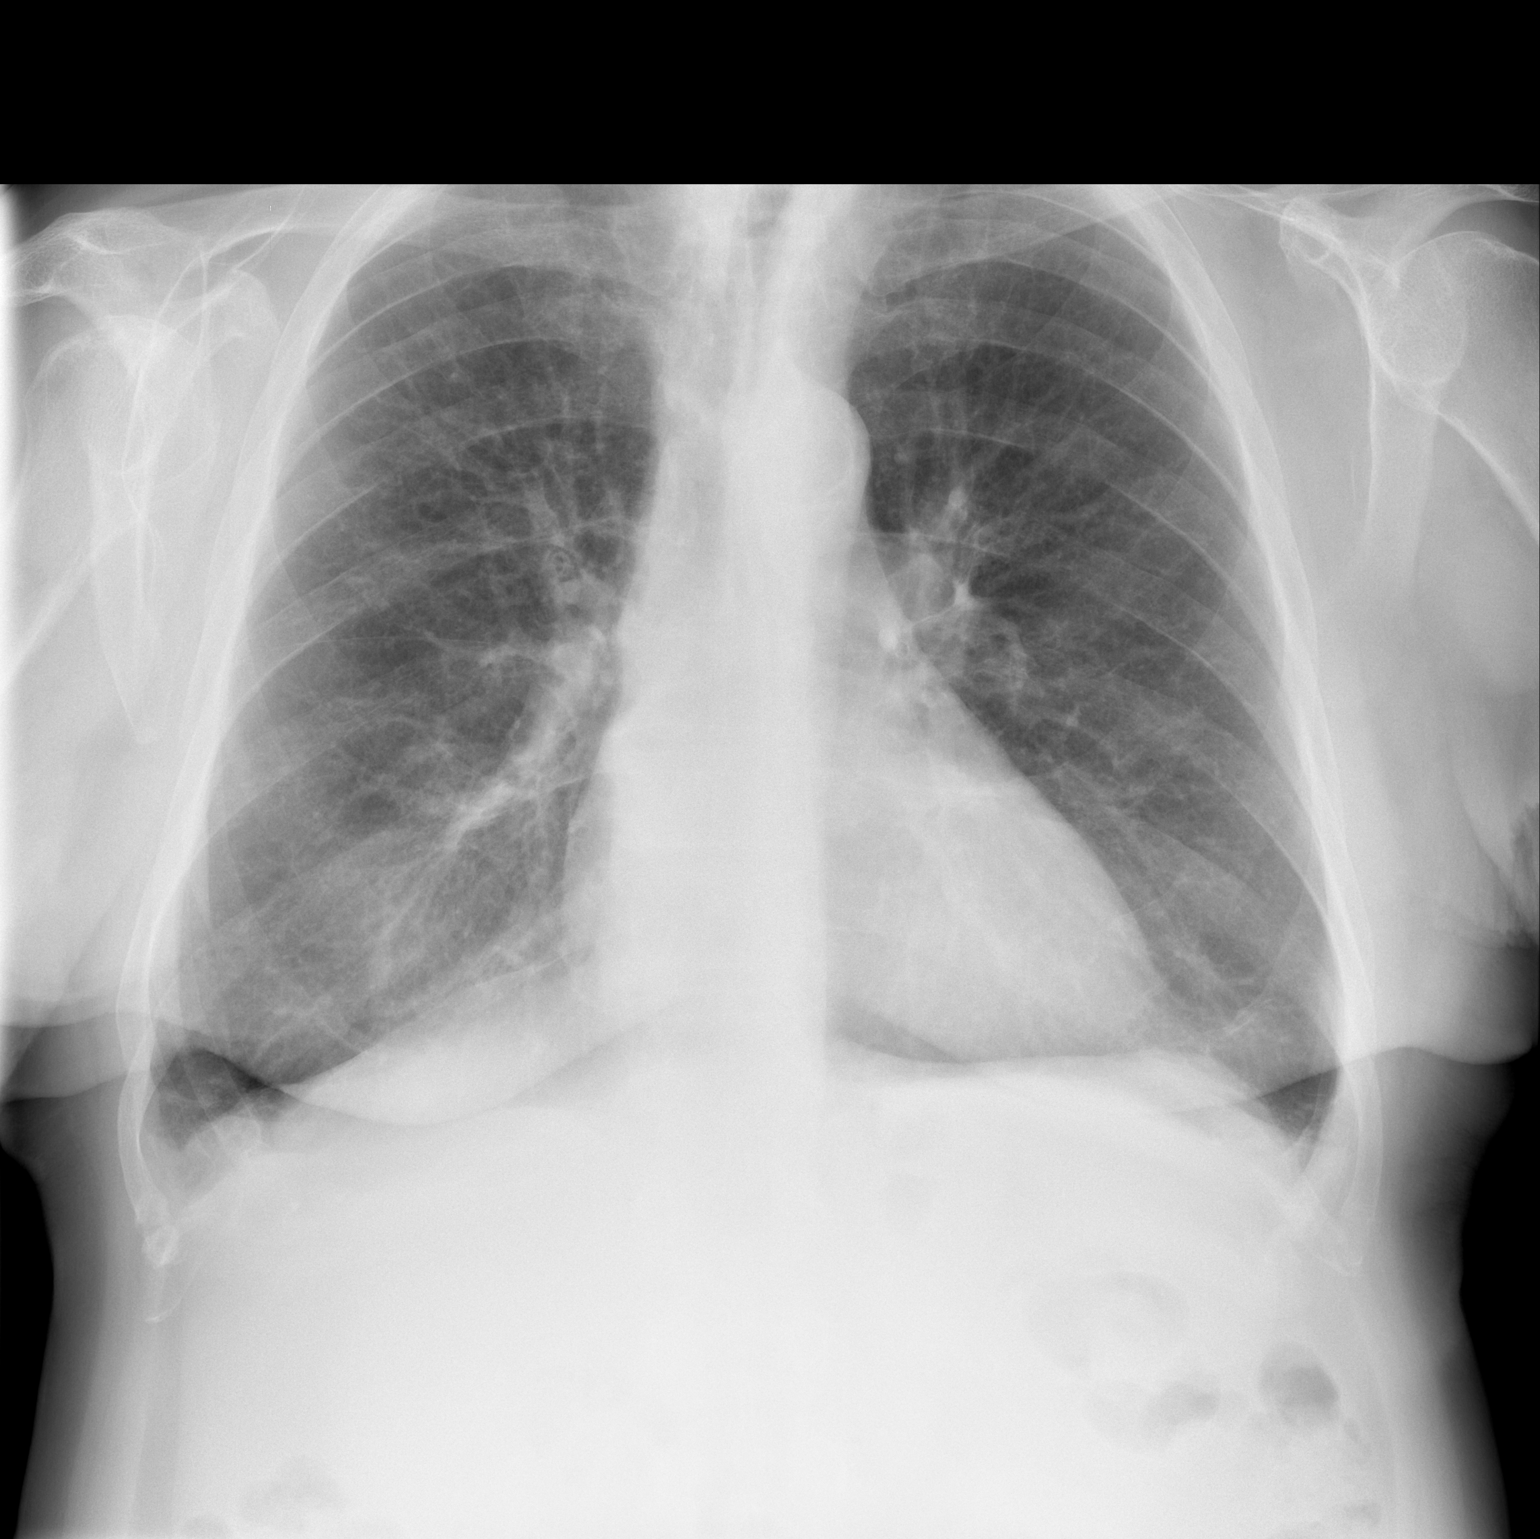

[w chest lat]
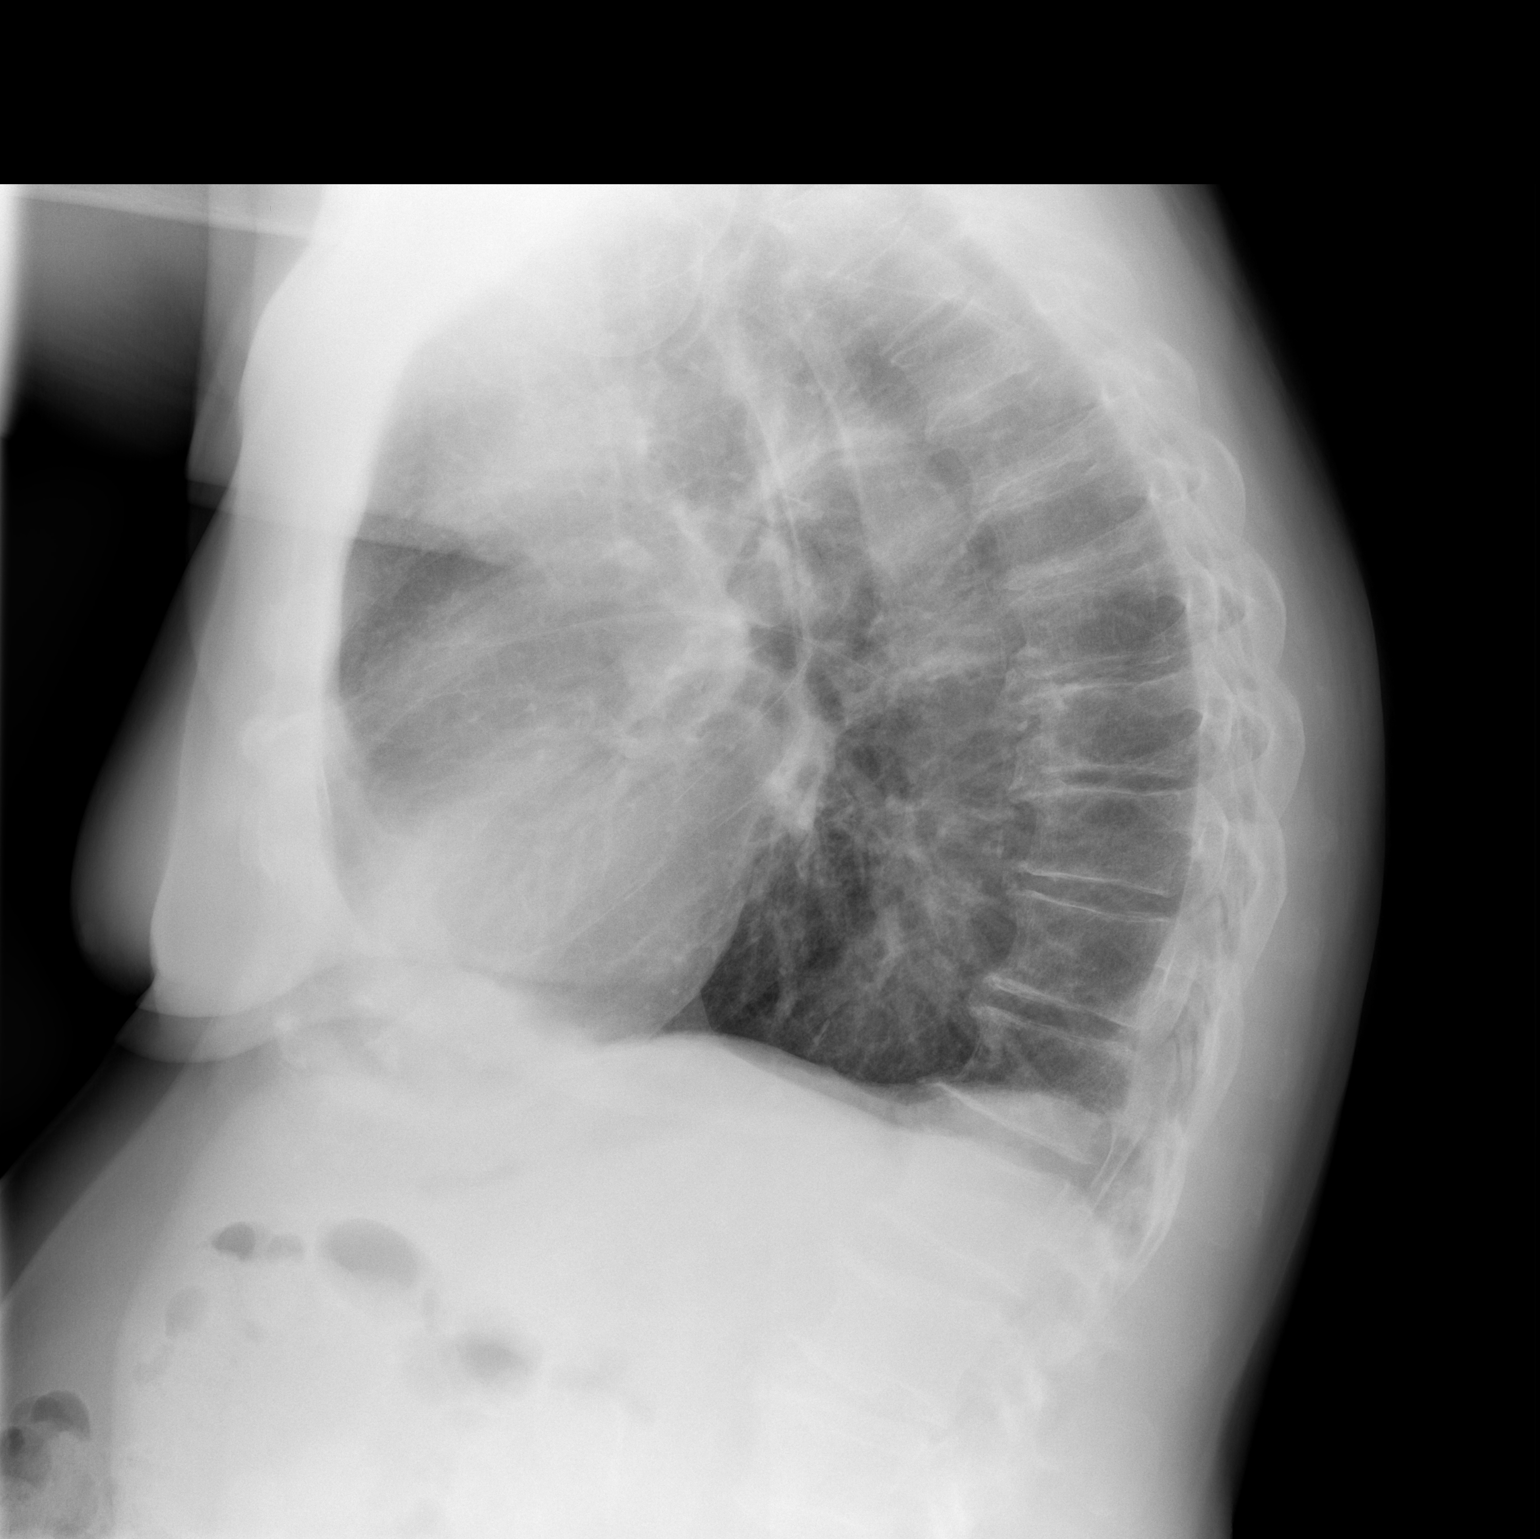

[2 of 2 positions shown; findings below may reference images not displayed]

FINDINGS: Heart is enlarged. Asymmetric airspace opacity is noted in the right
middle lobe. Lungs are otherwise clear. Changes of COPD noted. No
edema or effusion is present.
IMPRESSION: 1. Right middle lobe airspace disease is concerning for infection or
atelectasis.
2. Cardiomegaly without failure.
3. Changes of COPD.

## 2023-02-21 DIAGNOSIS — K449 Diaphragmatic hernia without obstruction or gangrene: Secondary | ICD-10-CM | POA: Diagnosis not present

## 2023-02-21 DIAGNOSIS — Z13810 Encounter for screening for upper gastrointestinal disorder: Secondary | ICD-10-CM | POA: Diagnosis not present

## 2023-02-21 DIAGNOSIS — K2270 Barrett's esophagus without dysplasia: Secondary | ICD-10-CM | POA: Diagnosis not present

## 2023-02-23 DIAGNOSIS — K2270 Barrett's esophagus without dysplasia: Secondary | ICD-10-CM | POA: Diagnosis not present

## 2023-03-08 DIAGNOSIS — J439 Emphysema, unspecified: Secondary | ICD-10-CM | POA: Diagnosis not present

## 2023-03-08 DIAGNOSIS — I10 Essential (primary) hypertension: Secondary | ICD-10-CM | POA: Diagnosis not present

## 2023-03-08 DIAGNOSIS — E7800 Pure hypercholesterolemia, unspecified: Secondary | ICD-10-CM | POA: Diagnosis not present

## 2023-03-08 DIAGNOSIS — Z0000 Encounter for general adult medical examination without abnormal findings: Secondary | ICD-10-CM | POA: Diagnosis not present

## 2023-03-08 DIAGNOSIS — N400 Enlarged prostate without lower urinary tract symptoms: Secondary | ICD-10-CM | POA: Diagnosis not present

## 2023-03-08 DIAGNOSIS — I700 Atherosclerosis of aorta: Secondary | ICD-10-CM | POA: Diagnosis not present

## 2023-03-08 DIAGNOSIS — Z23 Encounter for immunization: Secondary | ICD-10-CM | POA: Diagnosis not present

## 2023-03-08 DIAGNOSIS — N1831 Chronic kidney disease, stage 3a: Secondary | ICD-10-CM | POA: Diagnosis not present

## 2023-03-08 DIAGNOSIS — E1122 Type 2 diabetes mellitus with diabetic chronic kidney disease: Secondary | ICD-10-CM | POA: Diagnosis not present

## 2023-03-08 DIAGNOSIS — E1165 Type 2 diabetes mellitus with hyperglycemia: Secondary | ICD-10-CM | POA: Diagnosis not present

## 2023-04-16 DIAGNOSIS — R059 Cough, unspecified: Secondary | ICD-10-CM | POA: Diagnosis not present

## 2023-04-16 DIAGNOSIS — N401 Enlarged prostate with lower urinary tract symptoms: Secondary | ICD-10-CM | POA: Diagnosis not present

## 2023-04-26 ENCOUNTER — Ambulatory Visit: Payer: Medicare HMO | Admitting: Podiatry

## 2023-04-26 ENCOUNTER — Encounter: Payer: Self-pay | Admitting: Podiatry

## 2023-04-26 DIAGNOSIS — M79676 Pain in unspecified toe(s): Secondary | ICD-10-CM | POA: Diagnosis not present

## 2023-04-26 DIAGNOSIS — D689 Coagulation defect, unspecified: Secondary | ICD-10-CM | POA: Diagnosis not present

## 2023-04-26 DIAGNOSIS — B351 Tinea unguium: Secondary | ICD-10-CM

## 2023-04-29 NOTE — Progress Notes (Signed)
He presents today chief complaint of painful elongated toenails.  Objective: Toenails are long thick yellow dystrophic onychomycotic pulses are palpable no open lesions or wounds are noted.  Assessment: Pain in limb secondary onychomycosis.  Plan: Debridement of toenails 1 through 5 bilateral EXTR secondary to pain.

## 2023-06-20 ENCOUNTER — Other Ambulatory Visit: Payer: Self-pay | Admitting: Cardiology

## 2023-06-20 DIAGNOSIS — M545 Low back pain, unspecified: Secondary | ICD-10-CM | POA: Diagnosis not present

## 2023-06-21 DIAGNOSIS — M545 Low back pain, unspecified: Secondary | ICD-10-CM | POA: Diagnosis not present

## 2023-06-28 DIAGNOSIS — N401 Enlarged prostate with lower urinary tract symptoms: Secondary | ICD-10-CM | POA: Diagnosis not present

## 2023-06-28 DIAGNOSIS — E119 Type 2 diabetes mellitus without complications: Secondary | ICD-10-CM | POA: Diagnosis not present

## 2023-06-28 DIAGNOSIS — R300 Dysuria: Secondary | ICD-10-CM | POA: Diagnosis not present

## 2023-07-26 ENCOUNTER — Ambulatory Visit: Payer: Medicare HMO | Admitting: Podiatry

## 2023-07-29 NOTE — Progress Notes (Unsigned)
  Electrophysiology Office Note:   Date:  07/30/2023  ID:  REGINAL WOJCICKI, DOB 12/27/1940, MRN [id number]  Primary Cardiologist: None Primary Heart Failure: None Electrophysiologist: Will Jorja Loa, MD      History of Present Illness:   Jesse James is a 83 y.o. male with h/o PVC's, bradycardia, CAD, HTN, HLD, PAD, DM II, CKD III seen today for routine electrophysiology followup.   Since last being seen in our clinic the patient reports he recently moved to [place] [facility] with his wife. He is participating in water aerobics.  He is enjoying living in a senior community.  He is unaware of PVC's / asymptomatic.   He denies chest pain, palpitations, dyspnea, PND, orthopnea, nausea, vomiting, dizziness, syncope, edema, weight gain, or early satiety.   Review of systems complete and found to be negative unless listed in HPI.   EP Information / Studies Reviewed:    EKG is ordered today. Personal review as below.  EKG Interpretation Date/Time:  Monday July 30 2023 14:12:51 EDT Ventricular Rate:  63 PR Interval:  178 QRS Duration:  80 QT Interval:  392 QTC Calculation: 401 R Axis:   79  Text Interpretation: Normal sinus rhythm Confirmed by Canary Brim ([id number]) on 07/30/2023 2:53:17 PM   Studies:  Cardiac Monitor 09/2020 > PVC's 20.3% ECHO 09/2020 > LVEF 60-65%, LA mildly dilated Gated Stress Test 10/25/2020 > LVEF 55-65%, no ischemia noted, low risk study Cardiac Monitor 02/2021 > PVC's 7.1%   Arrhythmia / AAD PVC's  Propafenone     Risk Assessment/Calculations:              Physical Exam:   VS:  BP 134/70 (BP Location: Left Arm, Patient Position: Sitting, Cuff Size: Large)   Pulse 63   Ht 5\' 8"  (1.727 m)   Wt 196 lb (88.9 kg)   SpO2 97%   BMI 29.80 kg/m    Wt Readings from Last 3 Encounters:  07/30/23 196 lb (88.9 kg)  01/29/23 201 lb 6.4 oz (91.4 kg)  07/17/22 195 lb 3.2 oz (88.5 kg)     GEN: Well nourished, well developed in no acute distress NECK: No  JVD; No carotid bruits CARDIAC: Regular rate and rhythm, no murmurs, rubs, gallops RESPIRATORY:  Clear to auscultation without rales, wheezing or rhonchi  ABDOMEN: Soft, non-tender, non-distended EXTREMITIES:  No edema; No deformity   ASSESSMENT AND PLAN:    PVC's  Bradycardia  Prior 20% burden, reduced to 7% on propafenone -continue propafenone 225 mg BID > no issues with cost at this time. Discussed if formulary were to change and cost prohibitive, let clinic know -largely asymptomatic / unaware of PVC's  -EKG on propafenone NSR with stable intervals   Hypertension  -well controlled on current regimen   CAD HLD  -per primary   Hx TIA  In ~ 2012 -on Plavix + ASA  Follow up with Dr. Elberta Fortis in 12 months  Signed, Canary Brim, NP-C, AGACNP-BC [facility] - Electrophysiology  07/30/2023, 3:07 PM

## 2023-07-30 ENCOUNTER — Ambulatory Visit: Payer: Medicare HMO | Attending: Pulmonary Disease | Admitting: Pulmonary Disease

## 2023-07-30 ENCOUNTER — Encounter: Payer: Self-pay | Admitting: Pulmonary Disease

## 2023-07-30 VITALS — BP 134/70 | HR 63 | Ht 68.0 in | Wt 196.0 lb

## 2023-07-30 DIAGNOSIS — I10 Essential (primary) hypertension: Secondary | ICD-10-CM

## 2023-07-30 DIAGNOSIS — I493 Ventricular premature depolarization: Secondary | ICD-10-CM

## 2023-07-30 DIAGNOSIS — R001 Bradycardia, unspecified: Secondary | ICD-10-CM

## 2023-07-30 NOTE — Patient Instructions (Signed)
 Medication Instructions:  Your physician recommends that you continue on your current medications as directed. Please refer to the Current Medication list given to you today.  *If you need a refill on your cardiac medications before your next appointment, please call your pharmacy*  Lab Work: None ordered If you have labs (blood work) drawn today and your tests are completely normal, you will receive your results only by: MyChart Message (if you have MyChart) OR A paper copy in the mail If you have any lab test that is abnormal or we need to change your treatment, we will call you to review the results.   Follow-Up: At [facility], you and your health needs are our priority.  As part of our continuing mission to provide you with exceptional heart care, we have created designated Provider Care Teams.  These Care Teams include your primary Cardiologist (physician) and Advanced Practice Providers (APPs -  Physician Assistants and Nurse Practitioners) who all work together to provide you with the care you need, when you need it.  Your next appointment:   1 year(s)  Provider:   You may see Will Jorja Loa, MD or one of the following Advanced Practice Providers on your designated Care Team:   Francis Dowse, [place] [address]" [place], [place] Sherie Don, NP Canary Brim, NP

## 2023-07-31 ENCOUNTER — Ambulatory Visit: Payer: Medicare HMO | Admitting: Podiatry

## 2023-08-07 DIAGNOSIS — M545 Low back pain, unspecified: Secondary | ICD-10-CM | POA: Diagnosis not present

## 2023-08-07 DIAGNOSIS — R21 Rash and other nonspecific skin eruption: Secondary | ICD-10-CM | POA: Diagnosis not present

## 2023-08-16 ENCOUNTER — Encounter: Payer: Self-pay | Admitting: Podiatry

## 2023-08-16 ENCOUNTER — Ambulatory Visit: Admitting: Podiatry

## 2023-08-16 DIAGNOSIS — M79676 Pain in unspecified toe(s): Secondary | ICD-10-CM

## 2023-08-16 DIAGNOSIS — B351 Tinea unguium: Secondary | ICD-10-CM | POA: Diagnosis not present

## 2023-08-16 DIAGNOSIS — D689 Coagulation defect, unspecified: Secondary | ICD-10-CM

## 2023-08-19 NOTE — Progress Notes (Signed)
 He presents today chief complaint of painful elongated toenails.  Objective: Toenails are long thick yellow dystrophic onychomycotic pulses are palpable no open lesions or wounds are noted.  Assessment: Pain in limb secondary onychomycosis.  Plan: Debridement of toenails 1 through 5 bilateral secondary to pain.

## 2023-09-06 DIAGNOSIS — E871 Hypo-osmolality and hyponatremia: Secondary | ICD-10-CM | POA: Diagnosis not present

## 2023-09-06 DIAGNOSIS — I10 Essential (primary) hypertension: Secondary | ICD-10-CM | POA: Diagnosis not present

## 2023-09-06 DIAGNOSIS — R600 Localized edema: Secondary | ICD-10-CM | POA: Diagnosis not present

## 2023-09-06 DIAGNOSIS — E1122 Type 2 diabetes mellitus with diabetic chronic kidney disease: Secondary | ICD-10-CM | POA: Diagnosis not present

## 2023-09-06 DIAGNOSIS — E7800 Pure hypercholesterolemia, unspecified: Secondary | ICD-10-CM | POA: Diagnosis not present

## 2023-09-06 DIAGNOSIS — N400 Enlarged prostate without lower urinary tract symptoms: Secondary | ICD-10-CM | POA: Diagnosis not present

## 2023-09-06 DIAGNOSIS — M545 Low back pain, unspecified: Secondary | ICD-10-CM | POA: Diagnosis not present

## 2023-09-06 DIAGNOSIS — E8770 Fluid overload, unspecified: Secondary | ICD-10-CM | POA: Diagnosis not present

## 2023-11-15 ENCOUNTER — Encounter: Payer: Self-pay | Admitting: Podiatry

## 2023-11-15 ENCOUNTER — Ambulatory Visit: Admitting: Podiatry

## 2023-11-15 DIAGNOSIS — D689 Coagulation defect, unspecified: Secondary | ICD-10-CM | POA: Diagnosis not present

## 2023-11-15 DIAGNOSIS — B351 Tinea unguium: Secondary | ICD-10-CM

## 2023-11-15 DIAGNOSIS — M79676 Pain in unspecified toe(s): Secondary | ICD-10-CM

## 2023-11-15 NOTE — Progress Notes (Signed)
 He presents today chief complaint of painful elongated toenails.  Objective: Toenails are long thick yellow dystrophic onychomycotic pulses are palpable no open lesions or wounds are noted.  Assessment: Pain in limb secondary onychomycosis.  Plan: Debridement of toenails 1 through 5 bilateral secondary to pain.

## 2023-11-20 DIAGNOSIS — E871 Hypo-osmolality and hyponatremia: Secondary | ICD-10-CM | POA: Diagnosis not present

## 2023-11-20 DIAGNOSIS — R197 Diarrhea, unspecified: Secondary | ICD-10-CM | POA: Diagnosis not present

## 2023-11-22 DIAGNOSIS — R197 Diarrhea, unspecified: Secondary | ICD-10-CM | POA: Diagnosis not present

## 2023-11-23 DIAGNOSIS — E871 Hypo-osmolality and hyponatremia: Secondary | ICD-10-CM | POA: Diagnosis not present

## 2023-11-28 DIAGNOSIS — E871 Hypo-osmolality and hyponatremia: Secondary | ICD-10-CM | POA: Diagnosis not present

## 2023-11-28 DIAGNOSIS — R197 Diarrhea, unspecified: Secondary | ICD-10-CM | POA: Diagnosis not present

## 2023-12-06 DIAGNOSIS — H43813 Vitreous degeneration, bilateral: Secondary | ICD-10-CM | POA: Diagnosis not present

## 2023-12-06 DIAGNOSIS — E119 Type 2 diabetes mellitus without complications: Secondary | ICD-10-CM | POA: Diagnosis not present

## 2023-12-06 DIAGNOSIS — H02834 Dermatochalasis of left upper eyelid: Secondary | ICD-10-CM | POA: Diagnosis not present

## 2023-12-06 DIAGNOSIS — H5010 Unspecified exotropia: Secondary | ICD-10-CM | POA: Diagnosis not present

## 2023-12-06 DIAGNOSIS — H02831 Dermatochalasis of right upper eyelid: Secondary | ICD-10-CM | POA: Diagnosis not present

## 2023-12-06 DIAGNOSIS — Z961 Presence of intraocular lens: Secondary | ICD-10-CM | POA: Diagnosis not present

## 2023-12-06 DIAGNOSIS — H04123 Dry eye syndrome of bilateral lacrimal glands: Secondary | ICD-10-CM | POA: Diagnosis not present

## 2023-12-11 ENCOUNTER — Other Ambulatory Visit: Payer: Self-pay | Admitting: Cardiology

## 2024-01-29 DIAGNOSIS — R059 Cough, unspecified: Secondary | ICD-10-CM | POA: Diagnosis not present

## 2024-01-29 DIAGNOSIS — R197 Diarrhea, unspecified: Secondary | ICD-10-CM | POA: Diagnosis not present

## 2024-01-29 DIAGNOSIS — Z03818 Encounter for observation for suspected exposure to other biological agents ruled out: Secondary | ICD-10-CM | POA: Diagnosis not present

## 2024-01-29 DIAGNOSIS — R194 Change in bowel habit: Secondary | ICD-10-CM | POA: Diagnosis not present

## 2024-01-29 DIAGNOSIS — R5383 Other fatigue: Secondary | ICD-10-CM | POA: Diagnosis not present

## 2024-02-21 ENCOUNTER — Encounter: Payer: Self-pay | Admitting: Podiatry

## 2024-02-21 ENCOUNTER — Ambulatory Visit: Admitting: Podiatry

## 2024-02-21 DIAGNOSIS — D689 Coagulation defect, unspecified: Secondary | ICD-10-CM | POA: Diagnosis not present

## 2024-02-21 DIAGNOSIS — B351 Tinea unguium: Secondary | ICD-10-CM

## 2024-02-21 DIAGNOSIS — M79676 Pain in unspecified toe(s): Secondary | ICD-10-CM

## 2024-02-22 NOTE — Progress Notes (Signed)
 He presents today chief complaint of painful elongated toenails.  Objective: Toenails are long thick yellow dystrophic onychomycotic pulses are palpable no open lesions or wounds are noted.  Assessment: Pain in limb secondary onychomycosis.  Plan: Debridement of toenails 1 through 5 bilateral secondary to pain.

## 2024-03-13 DIAGNOSIS — I700 Atherosclerosis of aorta: Secondary | ICD-10-CM | POA: Diagnosis not present

## 2024-03-13 DIAGNOSIS — I10 Essential (primary) hypertension: Secondary | ICD-10-CM | POA: Diagnosis not present

## 2024-03-13 DIAGNOSIS — Z1331 Encounter for screening for depression: Secondary | ICD-10-CM | POA: Diagnosis not present

## 2024-03-13 DIAGNOSIS — N400 Enlarged prostate without lower urinary tract symptoms: Secondary | ICD-10-CM | POA: Diagnosis not present

## 2024-03-13 DIAGNOSIS — E8770 Fluid overload, unspecified: Secondary | ICD-10-CM | POA: Diagnosis not present

## 2024-03-13 DIAGNOSIS — J439 Emphysema, unspecified: Secondary | ICD-10-CM | POA: Diagnosis not present

## 2024-03-13 DIAGNOSIS — E1151 Type 2 diabetes mellitus with diabetic peripheral angiopathy without gangrene: Secondary | ICD-10-CM | POA: Diagnosis not present

## 2024-03-13 DIAGNOSIS — E7800 Pure hypercholesterolemia, unspecified: Secondary | ICD-10-CM | POA: Diagnosis not present

## 2024-03-13 DIAGNOSIS — E1122 Type 2 diabetes mellitus with diabetic chronic kidney disease: Secondary | ICD-10-CM | POA: Diagnosis not present

## 2024-03-13 DIAGNOSIS — Z0000 Encounter for general adult medical examination without abnormal findings: Secondary | ICD-10-CM | POA: Diagnosis not present

## 2024-03-16 ENCOUNTER — Other Ambulatory Visit: Payer: Self-pay | Admitting: Cardiology

## 2024-05-27 ENCOUNTER — Ambulatory Visit: Admitting: Podiatry

## 2024-05-27 ENCOUNTER — Encounter: Payer: Self-pay | Admitting: Podiatry

## 2024-05-27 DIAGNOSIS — D689 Coagulation defect, unspecified: Secondary | ICD-10-CM

## 2024-05-27 DIAGNOSIS — B351 Tinea unguium: Secondary | ICD-10-CM

## 2024-05-27 DIAGNOSIS — E1165 Type 2 diabetes mellitus with hyperglycemia: Secondary | ICD-10-CM | POA: Insufficient documentation

## 2024-05-27 DIAGNOSIS — M79676 Pain in unspecified toe(s): Secondary | ICD-10-CM

## 2024-05-27 NOTE — Progress Notes (Signed)
 He presents today chief complaint of painful elongated toenails.  Objective: Toenails are long thick yellow dystrophic onychomycotic pulses are palpable no open lesions or wounds are noted.  Assessment: Pain in limb secondary onychomycosis.  Plan: Debridement of toenails 1 through 5 bilateral secondary to pain.

## 2024-08-26 ENCOUNTER — Ambulatory Visit: Admitting: Podiatry
